# Patient Record
Sex: Female | Born: 1985 | Race: Black or African American | Hispanic: No | Marital: Single | Smoking: Former smoker
Health system: Southern US, Community
[De-identification: ages and names within clinical notes are randomized; demographics above are authoritative.]

## PROBLEM LIST (undated history)

## (undated) ENCOUNTER — Inpatient Hospital Stay (HOSPITAL_COMMUNITY): Payer: Self-pay

## (undated) DIAGNOSIS — Z789 Other specified health status: Secondary | ICD-10-CM

## (undated) DIAGNOSIS — N883 Incompetence of cervix uteri: Secondary | ICD-10-CM

## (undated) HISTORY — PX: NO PAST SURGERIES: SHX2092

---

## 2004-06-22 DIAGNOSIS — N883 Incompetence of cervix uteri: Secondary | ICD-10-CM

## 2005-06-22 HISTORY — PX: CERVICAL CERCLAGE: SHX1329

## 2011-06-19 ENCOUNTER — Encounter: Payer: Self-pay | Admitting: Emergency Medicine

## 2011-06-19 ENCOUNTER — Emergency Department (HOSPITAL_BASED_OUTPATIENT_CLINIC_OR_DEPARTMENT_OTHER)
Admission: EM | Admit: 2011-06-19 | Discharge: 2011-06-20 | Disposition: A | Discharge status: Home / Self Care | Payer: Self-pay | Attending: Emergency Medicine | Admitting: Emergency Medicine

## 2011-06-19 DIAGNOSIS — R109 Unspecified abdominal pain: Secondary | ICD-10-CM | POA: Insufficient documentation

## 2011-06-19 DIAGNOSIS — A5901 Trichomonal vulvovaginitis: Secondary | ICD-10-CM | POA: Insufficient documentation

## 2011-06-19 LAB — URINALYSIS, ROUTINE W REFLEX MICROSCOPIC
Glucose, UA: NEGATIVE mg/dL
Ketones, ur: NEGATIVE mg/dL
Protein, ur: NEGATIVE mg/dL

## 2011-06-19 LAB — URINE MICROSCOPIC-ADD ON

## 2011-06-19 LAB — WET PREP, GENITAL: Yeast Wet Prep HPF POC: NONE SEEN

## 2011-06-19 NOTE — ED Provider Notes (Signed)
History     CSN: [id number]  Arrival date & time 06/19/11  4098   First MD Initiated Contact with Patient 06/19/11 2244      Chief Complaint  Patient presents with  . Abdominal Pain    (Consider location/radiation/quality/duration/timing/severity/associated sxs/prior treatment) The history is provided by the patient.   Patient states that her stomach has been feeling "uncomfortable" for three days.  She states she wouldn't call it an ache but uncomfortable.  Patient increased frequency of loose bowel movements but no nausea or vomiting.  LMP 12/19 normal.  [id number].  No history of pid, history of gc in past.  Some vaginal discharge now.   History reviewed. No pertinent past medical history.  History reviewed. No pertinent past surgical history.  No family history on file.  History  Substance Use Topics  . Smoking status: Never Smoker   . Smokeless tobacco: Not on file  . Alcohol Use: No    OB History    Grav Para Term Preterm Abortions TAB SAB Ect Mult Living                  Review of Systems  All other systems reviewed and are negative.    Allergies  Review of patient's allergies indicates no known allergies.  Home Medications  No current outpatient prescriptions on file.  BP 117/75  Pulse 97  Temp(Src) 98.1 F (36.7 C) (Oral)  Resp 18  Wt 150 lb (68.04 kg)  SpO2 100%  LMP 06/05/2011  Physical Exam  Nursing note and vitals reviewed. Constitutional: She is oriented to person, place, and time. She appears well-developed and well-nourished.  HENT:  Head: Normocephalic and atraumatic.  Right Ear: External ear normal.  Left Ear: External ear normal.  Nose: Nose normal.  Mouth/Throat: Oropharynx is clear and moist.  Eyes: Conjunctivae are normal. Pupils are equal, round, and reactive to light.  Neck: Normal range of motion. Neck supple.  Cardiovascular: Normal rate, regular rhythm and normal heart sounds.   Pulmonary/Chest: Effort normal and breath  sounds normal.  Abdominal: Soft. Bowel sounds are normal.  Genitourinary: No tenderness or bleeding around the vagina. Vaginal discharge found.  Musculoskeletal: Normal range of motion.  Neurological: She is alert and oriented to person, place, and time.  Skin: Skin is warm and dry.  Psychiatric: She has a normal mood and affect. Her behavior is normal. Thought content normal.    ED Course  Procedures (including critical care time)  Labs Reviewed  URINALYSIS, ROUTINE W REFLEX MICROSCOPIC - Abnormal; Notable for the following:    Leukocytes, UA MODERATE (*)    All other components within normal limits  URINE MICROSCOPIC-ADD ON - Abnormal; Notable for the following:    Squamous Epithelial / LPF FEW (*)    All other components within normal limits  PREGNANCY, URINE   No results found.   No diagnosis found. Results for orders placed during the hospital encounter of 06/19/11  URINALYSIS, ROUTINE W REFLEX MICROSCOPIC      Component Value Range   Color, Urine YELLOW  YELLOW    APPearance CLEAR  CLEAR    Specific Gravity, Urine 1.026  1.005 - 1.030    pH 6.0  5.0 - 8.0    Glucose, UA NEGATIVE  NEGATIVE (mg/dL)   Hgb urine dipstick NEGATIVE  NEGATIVE    Bilirubin Urine NEGATIVE  NEGATIVE    Ketones, ur NEGATIVE  NEGATIVE (mg/dL)   Protein, ur NEGATIVE  NEGATIVE (mg/dL)   Urobilinogen, UA 1.0  0.0 - 1.0 (mg/dL)   Nitrite NEGATIVE  NEGATIVE    Leukocytes, UA MODERATE (*) NEGATIVE   PREGNANCY, URINE      Component Value Range   Preg Test, Ur NEGATIVE    URINE MICROSCOPIC-ADD ON      Component Value Range   Squamous Epithelial / LPF FEW (*) RARE    WBC, UA 7-10  <3 (WBC/hpf)   Bacteria, UA RARE  RARE   GC/CHLAMYDIA PROBE AMP, GENITAL      Component Value Range   GC Probe Amp, Genital NEGATIVE  NEGATIVE    Chlamydia, DNA Probe NEGATIVE  NEGATIVE   WET PREP, GENITAL      Component Value Range   Yeast, Wet Prep NONE SEEN  NONE SEEN    Trich, Wet Prep FEW (*) NONE SEEN    Clue  Cells, Wet Prep NONE SEEN  NONE SEEN    WBC, Wet Prep HPF POC MODERATE (*) NONE SEEN       MDM    Patient treated for trich, given trich infection patient treated prophylactically for gc and chlamydia     Hilario Quarry, MD 06/23/11 [phone]

## 2011-06-19 NOTE — ED Notes (Signed)
Pt states she has "discomfort" in abd since 12/24. Pt denies n/v/d but states BM have been more frequent.

## 2011-06-20 MED ORDER — ONDANSETRON 8 MG PO TBDP
8.0000 mg | ORAL_TABLET | Freq: Once | ORAL | Status: AC
  Administered 2011-06-20: 8 mg via ORAL
  Filled 2011-06-20: qty 1

## 2011-06-20 MED ORDER — METRONIDAZOLE 500 MG PO TABS
2000.0000 mg | ORAL_TABLET | Freq: Once | ORAL | Status: AC
  Administered 2011-06-20: 2000 mg via ORAL
  Filled 2011-06-20: qty 4

## 2011-06-20 MED ORDER — CEFTRIAXONE SODIUM 250 MG IJ SOLR
250.0000 mg | Freq: Once | INTRAMUSCULAR | Status: AC
  Administered 2011-06-20: 250 mg via INTRAMUSCULAR
  Filled 2011-06-20: qty 250

## 2011-06-20 MED ORDER — AZITHROMYCIN 250 MG PO TABS
1000.0000 mg | ORAL_TABLET | Freq: Once | ORAL | Status: AC
  Administered 2011-06-20: 1000 mg via ORAL
  Filled 2011-06-20: qty 4

## 2011-06-22 LAB — GC/CHLAMYDIA PROBE AMP, GENITAL: GC Probe Amp, Genital: NEGATIVE

## 2015-01-29 ENCOUNTER — Inpatient Hospital Stay (HOSPITAL_COMMUNITY): Payer: Self-pay

## 2015-01-29 ENCOUNTER — Inpatient Hospital Stay (HOSPITAL_COMMUNITY)
Admission: AD | Admit: 2015-01-29 | Discharge: 2015-01-29 | Disposition: A | Discharge status: Home / Self Care | Payer: Self-pay | Source: Ambulatory Visit | Attending: Obstetrics & Gynecology | Admitting: Obstetrics & Gynecology

## 2015-01-29 ENCOUNTER — Encounter (HOSPITAL_COMMUNITY): Payer: Self-pay | Admitting: *Deleted

## 2015-01-29 DIAGNOSIS — A599 Trichomoniasis, unspecified: Secondary | ICD-10-CM

## 2015-01-29 DIAGNOSIS — O046 Delayed or excessive hemorrhage following (induced) termination of pregnancy: Secondary | ICD-10-CM | POA: Insufficient documentation

## 2015-01-29 DIAGNOSIS — O209 Hemorrhage in early pregnancy, unspecified: Secondary | ICD-10-CM

## 2015-01-29 DIAGNOSIS — O0380 Unspecified complication following complete or unspecified spontaneous abortion: Secondary | ICD-10-CM

## 2015-01-29 HISTORY — DX: Other specified health status: Z78.9

## 2015-01-29 LAB — CBC WITH DIFFERENTIAL/PLATELET
BASOS ABS: 0 10*3/uL (ref 0.0–0.1)
BASOS PCT: 0 % (ref 0–1)
EOS ABS: 0.2 10*3/uL (ref 0.0–0.7)
Eosinophils Relative: 2 % (ref 0–5)
HCT: 38.9 % (ref 36.0–46.0)
HEMOGLOBIN: 12.8 g/dL (ref 12.0–15.0)
LYMPHS PCT: 29 % (ref 12–46)
Lymphs Abs: 2.5 10*3/uL (ref 0.7–4.0)
MCH: 29 pg (ref 26.0–34.0)
MCHC: 32.9 g/dL (ref 30.0–36.0)
MCV: 88 fL (ref 78.0–100.0)
Monocytes Absolute: 0.5 10*3/uL (ref 0.1–1.0)
Monocytes Relative: 6 % (ref 3–12)
NEUTROS PCT: 63 % (ref 43–77)
Neutro Abs: 5.4 10*3/uL (ref 1.7–7.7)
PLATELETS: 274 10*3/uL (ref 150–400)
RBC: 4.42 MIL/uL (ref 3.87–5.11)
RDW: 13.2 % (ref 11.5–15.5)
WBC: 8.6 10*3/uL (ref 4.0–10.5)

## 2015-01-29 LAB — TYPE AND SCREEN
ABO/RH(D): A POS
Antibody Screen: NEGATIVE

## 2015-01-29 LAB — URINALYSIS, ROUTINE W REFLEX MICROSCOPIC
BILIRUBIN URINE: NEGATIVE
GLUCOSE, UA: NEGATIVE mg/dL
KETONES UR: NEGATIVE mg/dL
NITRITE: NEGATIVE
PROTEIN: 30 mg/dL — AB
Specific Gravity, Urine: 1.02 (ref 1.005–1.030)
Urobilinogen, UA: 2 mg/dL — ABNORMAL HIGH (ref 0.0–1.0)
pH: 8.5 — ABNORMAL HIGH (ref 5.0–8.0)

## 2015-01-29 LAB — HCG, QUANTITATIVE, PREGNANCY: hCG, Beta Chain, Quant, S: 55 m[IU]/mL — ABNORMAL HIGH (ref <5)

## 2015-01-29 LAB — ABO/RH: ABO/RH(D): A POS

## 2015-01-29 LAB — URINE MICROSCOPIC-ADD ON

## 2015-01-29 LAB — WET PREP, GENITAL
Clue Cells Wet Prep HPF POC: NONE SEEN
Yeast Wet Prep HPF POC: NONE SEEN

## 2015-01-29 MED ORDER — METRONIDAZOLE 500 MG PO TABS
2000.0000 mg | ORAL_TABLET | Freq: Once | ORAL | Status: AC
  Administered 2015-01-29: 2000 mg via ORAL
  Filled 2015-01-29: qty 4

## 2015-01-29 MED ORDER — IBUPROFEN 600 MG PO TABS: 600.0000 mg | ORAL_TABLET | Freq: Four times a day (QID) | ORAL | Status: DC | PRN

## 2015-01-29 NOTE — MAU Note (Signed)
Pt. States she had an abortion on June 16 and had light to heavy bleeding on and off. States that for the last few days she had increased bleeding and more clots. Used 1 pad per hour today. Has been soaking throw her clothes today. Negative pregnancy test on 01/19/15. Pt. States she has on and off cramping. Has not had a period since procedure that she knows of. Denies fever and chills. Pt. States she has nausea and vomiting but not consistent. Does not have a follow up with regular gyne.

## 2015-01-29 NOTE — Discharge Instructions (Signed)
Pelvic Rest Pelvic rest is sometimes recommended for women when:   The placenta is partially or completely covering the opening of the cervix (placenta previa).  There is bleeding between the uterine wall and the amniotic sac in the first trimester (subchorionic hemorrhage).  The cervix begins to open without labor starting (incompetent cervix, cervical insufficiency).  The labor is too early (preterm labor). HOME CARE INSTRUCTIONS  Do not have sexual intercourse, stimulation, or an orgasm.  Do not use tampons, douche, or put anything in the vagina.  Do not lift anything over 10 pounds (4.5 kg).  Avoid strenuous activity or straining your pelvic muscles. SEEK MEDICAL CARE IF:  You have any vaginal bleeding during pregnancy. Treat this as a potential emergency.  You have cramping pain felt low in the stomach (stronger than menstrual cramps).  You notice vaginal discharge (watery, mucus, or bloody).  You have a low, dull backache.  There are regular contractions or uterine tightening. SEEK IMMEDIATE MEDICAL CARE IF: You have vaginal bleeding and have placenta previa.  Document Released: 10/03/2010 Document Revised: 08/31/2011 Document Reviewed: 10/03/2010 [facility] Patient Information 2015 [place], [place]. This information is not intended to replace advice given to you by your health care provider. Make sure you discuss any questions you have with your health care provider.  Incomplete Abortion  In an incomplete abortion, parts of the fetus or placenta (afterbirth) remain in the body.    SYMPTOMS   Vaginal bleeding or spotting, with or without cramps or pain.  Pain or cramping in the abdomen or lower back.  Passing fluid, tissue, or blood clots from the vagina. DIAGNOSIS  Your health care provider will perform a physical exam. You may also have an ultrasound to confirm the miscarriage. Blood or urine tests may also be ordered. TREATMENT   Usually, a dilation and  curettage (D&C) procedure is performed. During a D&C procedure, the cervix is widened (dilated) and any remaining fetal or placental tissue is gently removed from the uterus.  Antibiotic medicines are prescribed if there is an infection. Other medicines may be given to reduce the size of the uterus (contract) if there is a lot of bleeding.  If you have Rh negative blood and your baby was Rh positive, you will need a Rho (D) immune globulin shot. This shot will protect any future baby from having Rh blood problems in future pregnancies.  You may be confined to bed rest. This means you should stay in bed and only get up to use the bathroom. HOME CARE INSTRUCTIONS   Rest as directed by your health care provider.  Restrict activity as directed by your health care provider. You may be allowed to continue light activity if curettage was not done but you require further treatment.  Keep track of the number of pads you use each day. Keep track of how soaked (saturated) they are. Record this information.  Do not  use tampons.  Do not douche or have sexual intercourse until approved by your health care provider.  Keep all follow-up appointments for reevaluation and continuing management.  Only take over-the-counter or prescription medicines for pain, fever, or discomfort as directed by your health care provider.  Take antibiotic medicine as directed by your health care provider. Make sure you finish it even if you start to feel better. SEEK IMMEDIATE MEDICAL CARE IF:   You experience severe cramps in your stomach, back, or abdomen.  You have an unexplained temperature (make sure to record these temperatures).  You  pass large clots or tissue (save these for your health care provider to inspect).  Your bleeding increases.  You become light-headed, weak, or have fainting episodes. MAKE SURE YOU:   Understand these instructions.  Will watch your condition.  Will get help right away if you  are not doing well or get worse. Document Released: 06/08/2005 Document Revised: 10/23/2013 Document Reviewed: 01/05/2013 [facility] Patient Information 2015 [place], [place]. This information is not intended to replace advice given to you by your health care provider. Make sure you discuss any questions you have with your health care provider.  Trichomoniasis Trichomoniasis is an infection caused by an organism called Trichomonas. The infection can affect both women and men. In women, the outer female genitalia and the vagina are affected. In men, the penis is mainly affected, but the prostate and other reproductive organs can also be involved. Trichomoniasis is a sexually transmitted infection (STI) and is most often passed to another person through sexual contact.  RISK FACTORS  Having unprotected sexual intercourse.  Having sexual intercourse with an infected partner. SIGNS AND SYMPTOMS  Symptoms of trichomoniasis in women include:  Abnormal gray-green frothy vaginal discharge.  Itching and irritation of the vagina.  Itching and irritation of the area outside the vagina. Symptoms of trichomoniasis in men include:   Penile discharge with or without pain.  Pain during urination. This results from inflammation of the urethra. DIAGNOSIS  Trichomoniasis may be found during a Pap test or physical exam. Your health care provider may use one of the following methods to help diagnose this infection:  Examining vaginal discharge under a microscope. For men, urethral discharge would be examined.  Testing the pH of the vagina with a test tape.  Using a vaginal swab test that checks for the Trichomonas organism. A test is available that provides results within a few minutes.  Doing a culture test for the organism. This is not usually needed. TREATMENT   You may be given medicine to fight the infection. Women should inform their health care provider if they could be or are pregnant. Some  medicines used to treat the infection should not be taken during pregnancy.  Your health care provider may recommend over-the-counter medicines or creams to decrease itching or irritation.  Your sexual partner will need to be treated if infected. HOME CARE INSTRUCTIONS   Take medicines only as directed by your health care provider.  Take over-the-counter medicine for itching or irritation as directed by your health care provider.  Do not have sexual intercourse while you have the infection.  Women should not douche or wear tampons while they have the infection.  Discuss your infection with your partner. Your partner may have gotten the infection from you, or you may have gotten it from your partner.  Have your sex partner get examined and treated if necessary.  Practice safe, informed, and protected sex.  See your health care provider for other STI testing. SEEK MEDICAL CARE IF:   You still have symptoms after you finish your medicine.  You develop abdominal pain.  You have pain when you urinate.  You have bleeding after sexual intercourse.  You develop a rash.  Your medicine makes you sick or makes you throw up (vomit). MAKE SURE YOU:  Understand these instructions.  Will watch your condition.  Will get help right away if you are not doing well or get worse. Document Released: 12/02/2000 Document Revised: 10/23/2013 Document Reviewed: 03/20/2013 [facility] Patient Information 2015 [place], [place]. This information  is not intended to replace advice given to you by your health care provider. Make sure you discuss any questions you have with your health care provider.

## 2015-01-29 NOTE — MAU Provider Note (Signed)
History     CSN: [id number]  Arrival date and time: 01/29/15 1548   First Provider Initiated Contact with Patient 01/29/15 1703      Chief Complaint  Patient presents with  . Vaginal Bleeding   This is a 29 y.o. female who is 7+ weeks post elective abortion presents with complaint of persistent vaginal bleeding.  Was seen by the abortion clinic last week and told pregnancy test was negative. States it got heavier yesterday and came in. Has some intermittent crampy pain at times. Does not radiate. Comes and goes.    Vaginal Bleeding The patient's primary symptoms include vaginal bleeding. The patient's pertinent negatives include no genital itching, genital lesions, genital odor or genital rash. This is a recurrent problem. The current episode started more than 1 month ago. The problem occurs constantly. The problem has been gradually worsening. The pain is mild. The problem affects both sides. She is pregnant (unsure if prior or new pregnancy). Associated symptoms include abdominal pain. Pertinent negatives include no back pain, chills, constipation, diarrhea, dysuria, fever, nausea or vomiting. The vaginal discharge was bloody. The vaginal bleeding is heavier than menses. She has been passing clots. She has not been passing tissue. Nothing aggravates the symptoms. She has tried nothing for the symptoms. She is sexually active. She uses nothing for contraception. Her past medical history is significant for a gynecological surgery (elective abortion).   \RN Note: Had AB 6/17; no complications. Bleeding has been up and down, has never stopped bleeding. Was to have f/u at 4 wks,called and did not go, went in at [facility] 7/30. Was still bleeding. Had [place] was told everything was fine, neg preg test. Bleeding had picked up 7/28, prior to being seen; was told it was probable cycle. Bleeding continues, has gotten heavier yesterday. Bled through clothes; short amt of time. Denies dizziness  OB History     Gravida Para Term Preterm AB TAB SAB Ectopic Multiple Living   5         1      No past medical history on file.  No past surgical history on file.  No family history on file.  History  Substance Use Topics  . Smoking status: Never Smoker   . Smokeless tobacco: Not on file  . Alcohol Use: No    Allergies: No Known Allergies  No prescriptions prior to admission   Medical, Surgical, Family and Social histories reviewed and are listed above.  Medications and allergies reviewed.   Review of Systems  Constitutional: Negative for fever, chills and malaise/fatigue.  Gastrointestinal: Positive for abdominal pain. Negative for nausea, vomiting, diarrhea and constipation.  Genitourinary: Positive for vaginal bleeding. Negative for dysuria.       Heavy vaginal bleeding, heavier today  Musculoskeletal: Negative for back pain.  other systems negative  Physical Exam   Blood pressure 121/70, pulse 68, temperature 98.7 F (37.1 C), temperature source Oral, resp. rate 20, height 5\' 4"  (1.626 m), weight 165 lb 12.8 oz (75.206 kg).  Physical Exam  Constitutional: She is oriented to person, place, and time. She appears well-developed and well-nourished. No distress.  HENT:  Head: Normocephalic.  Cardiovascular: Normal rate, regular rhythm and normal heart sounds.  Exam reveals no gallop and no friction rub.   No murmur heard. Respiratory: Effort normal and breath sounds normal. No respiratory distress. She has no wheezes. She has no rales. She exhibits no tenderness.  GI: Soft. She exhibits no distension and no mass. There is tenderness (  uterus slightly tender). There is no rebound and no guarding.  Genitourinary: Vaginal discharge found.  Speculum exam:  Moderate liquid dark red blood in vault Cervix closed, somewhat stenotic  Uterus 6-8 wk size Adnexae nontender  Musculoskeletal: Normal range of motion.  Neurological: She is alert and oriented to person, place, and time.   Skin: Skin is warm and dry.  Psychiatric: She has a normal mood and affect.   Results for orders placed or performed during the hospital encounter of 01/29/15 (from the past 24 hour(s))  Urinalysis, Routine w reflex microscopic (not at [facility])     Status: Abnormal   Collection Time: 01/29/15  4:05 PM  Result Value Ref Range   Color, Urine YELLOW YELLOW   APPearance HAZY (A) CLEAR   Specific Gravity, Urine 1.020 1.005 - 1.030   pH 8.5 (H) 5.0 - 8.0   Glucose, UA NEGATIVE NEGATIVE mg/dL   Hgb urine dipstick SMALL (A) NEGATIVE   Bilirubin Urine NEGATIVE NEGATIVE   Ketones, ur NEGATIVE NEGATIVE mg/dL   Protein, ur 30 (A) NEGATIVE mg/dL   Urobilinogen, UA 2.0 (H) 0.0 - 1.0 mg/dL   Nitrite NEGATIVE NEGATIVE   Leukocytes, UA MODERATE (A) NEGATIVE  Urine microscopic-add on     Status: Abnormal   Collection Time: 01/29/15  4:05 PM  Result Value Ref Range   Squamous Epithelial / LPF FEW (A) RARE   WBC, UA 21-50 <3 WBC/hpf   RBC / HPF 0-2 <3 RBC/hpf  hCG, quantitative, pregnancy     Status: Abnormal   Collection Time: 01/29/15  4:25 PM  Result Value Ref Range   hCG, Beta Chain, Quant, S 55 (H) <5 mIU/mL  CBC with Differential/Platelet     Status: None   Collection Time: 01/29/15  4:25 PM  Result Value Ref Range   WBC 8.6 4.0 - 10.5 K/uL   RBC 4.42 3.87 - 5.11 MIL/uL   Hemoglobin 12.8 12.0 - 15.0 g/dL   HCT 16.1 09.6 - 04.5 %   MCV 88.0 78.0 - 100.0 fL   MCH 29.0 26.0 - 34.0 pg   MCHC 32.9 30.0 - 36.0 g/dL   RDW 40.9 81.1 - 91.4 %   Platelets 274 150 - 400 K/uL   Neutrophils Relative % 63 43 - 77 %   Neutro Abs 5.4 1.7 - 7.7 K/uL   Lymphocytes Relative 29 12 - 46 %   Lymphs Abs 2.5 0.7 - 4.0 K/uL   Monocytes Relative 6 3 - 12 %   Monocytes Absolute 0.5 0.1 - 1.0 K/uL   Eosinophils Relative 2 0 - 5 %   Eosinophils Absolute 0.2 0.0 - 0.7 K/uL   Basophils Relative 0 0 - 1 %   Basophils Absolute 0.0 0.0 - 0.1 K/uL  Type and screen     Status: None   Collection Time: 01/29/15  4:25  PM  Result Value Ref Range   ABO/RH(D) A POS    Antibody Screen NEG    Sample Expiration 02/01/2015   Wet prep, genital     Status: Abnormal   Collection Time: 01/29/15  5:15 PM  Result Value Ref Range   Yeast Wet Prep HPF POC NONE SEEN NONE SEEN   Trich, Wet Prep FEW (A) NONE SEEN   Clue Cells Wet Prep HPF POC NONE SEEN NONE SEEN   WBC, Wet Prep HPF POC FEW (A) NONE SEEN   US Ob Comp Less 14 Wks  01/29/2015   CLINICAL DATA:  Bleeding.  EXAM: OBSTETRIC <  14 WK [place] AND TRANSVAGINAL OB US  TECHNIQUE: Both transabdominal and transvaginal ultrasound examinations were performed for complete evaluation of the gestation as well as the maternal uterus, adnexal regions, and pelvic cul-de-sac. Transvaginal technique was performed to assess early pregnancy.  COMPARISON:  None.  FINDINGS: Intrauterine gestational sac: The endometrium appears thickened, heterogeneous with areas of internal blood flow. Question 9 mm intrauterine fluid collection, image number 42 of 84.  Yolk sac:  No  Embryo:  No  Cardiac Activity: None  Heart Rate: Not applicable  bpm  Maternal uterus/adnexae:  Subchorionic hemorrhage: None  Right ovary: Normal  Left ovary: Normal  Other :None  Free fluid:  None  IMPRESSION: Thickened and heterogeneous appearance of the endometrium. No diagnostic intrauterine pregnancy is identified. Within this area of thickened endometrium is at least 1 cystic structure measuring 9 mm. In the setting of recent failed pregnancy this may represent retained products of conception. Less favored would be decidual reaction from new pregnancy with early intrauterine gestational sac. Careful clinical correlation and correlation with serial quantitative beta HCG is advised.   Electronically Signed   By: Signa Kell M.D.   On: 01/29/2015 18:25   US Ob Transvaginal  01/29/2015   CLINICAL DATA:  Bleeding.  EXAM: OBSTETRIC <14 WK [place] AND TRANSVAGINAL OB US  TECHNIQUE: Both transabdominal and transvaginal ultrasound  examinations were performed for complete evaluation of the gestation as well as the maternal uterus, adnexal regions, and pelvic cul-de-sac. Transvaginal technique was performed to assess early pregnancy.  COMPARISON:  None.  FINDINGS: Intrauterine gestational sac: The endometrium appears thickened, heterogeneous with areas of internal blood flow. Question 9 mm intrauterine fluid collection, image number 42 of 84.  Yolk sac:  No  Embryo:  No  Cardiac Activity: None  Heart Rate: Not applicable  bpm  Maternal uterus/adnexae:  Subchorionic hemorrhage: None  Right ovary: Normal  Left ovary: Normal  Other :None  Free fluid:  None  IMPRESSION: Thickened and heterogeneous appearance of the endometrium. No diagnostic intrauterine pregnancy is identified. Within this area of thickened endometrium is at least 1 cystic structure measuring 9 mm. In the setting of recent failed pregnancy this may represent retained products of conception. Less favored would be decidual reaction from new pregnancy with early intrauterine gestational sac. Careful clinical correlation and correlation with serial quantitative beta HCG is advised.   Electronically Signed   By: Signa Kell M.D.   On: 01/29/2015 18:25     MAU Course  Procedures  MDM Consulted Dr Debroah Loop.  Will check [place] since Quant is 15.  Cannot ascertain if this is retained tissue from prior pregnancy or new pregnancy. Patient does admit to having sex after abortion Will need to recheck HCG in 48 hours.   If still elevated, may need Methergine or Cytotec.  Assessment and Plan  A:  Post Abortion bleeding      Persistent HCG level      Cannot rule out ectopic, or retained products of conception, or new pregnancy  P:  Discharge home.       Recommend ectopic precautions and bleeding precautions. Notified she may continue to pass clots       Recommend repeat Quant HCG in 48 hours.        Decision re:  D&C vs medication to be made at that  time   [facility] 01/29/2015, 5:06 PM

## 2015-01-29 NOTE — MAU Note (Signed)
Had AB 6/17; no complications.   Bleeding has been up and down, has never stopped bleeding. Was to have f/u at 4 wks,called and did not go, went in at [facility] 7/30.  Was still bleeding.  Had [place] was told everything was fine, neg preg test. Bleeding had picked up 7/28, prior to being seen; was told it was probable cycle.  Bleeding continues, has gotten heavier yesterday.  Bled through clothes; short amt of time.  Denies dizziness.

## 2015-01-30 LAB — HIV ANTIBODY (ROUTINE TESTING W REFLEX): HIV SCREEN 4TH GENERATION: NONREACTIVE

## 2015-01-30 LAB — GC/CHLAMYDIA PROBE AMP (~~LOC~~) NOT AT ARMC
CHLAMYDIA, DNA PROBE: NEGATIVE
Neisseria Gonorrhea: NEGATIVE

## 2015-01-31 ENCOUNTER — Inpatient Hospital Stay (HOSPITAL_COMMUNITY)
Admission: AD | Admit: 2015-01-31 | Discharge: 2015-01-31 | Disposition: A | Discharge status: Home / Self Care | Payer: Self-pay | Source: Ambulatory Visit | Attending: Obstetrics & Gynecology | Admitting: Obstetrics & Gynecology

## 2015-01-31 DIAGNOSIS — O039 Complete or unspecified spontaneous abortion without complication: Secondary | ICD-10-CM

## 2015-01-31 DIAGNOSIS — Z332 Encounter for elective termination of pregnancy: Secondary | ICD-10-CM | POA: Insufficient documentation

## 2015-01-31 DIAGNOSIS — Z09 Encounter for follow-up examination after completed treatment for conditions other than malignant neoplasm: Secondary | ICD-10-CM | POA: Insufficient documentation

## 2015-01-31 LAB — HCG, QUANTITATIVE, PREGNANCY: HCG, BETA CHAIN, QUANT, S: 36 m[IU]/mL — AB (ref <5)

## 2015-01-31 NOTE — MAU Note (Signed)
Pt presents to MAU for follow up quant. Pt denies any pain reports small amount of bleeding

## 2015-01-31 NOTE — MAU Provider Note (Signed)
Subjective:   Pt is a 29 y.o. [id number] here for follow-up BHCG.  Upon review of the records patient was last seen on 01/31/15 for vaginal bleeding.   BHCG on that day was 55.  Ultrasound showed "thickened and heterogeneous appearance of the endometrium".  GC/CT and wet prep were collected.  Results were positive for trich; patient treated at that time. Pt here today with no report of abdominal pain. States vaginal bleeding is improving.     Patient had TAB 6/17 in [place]; had f/u in [place] on 7/30 with normal ultrasound and negative UPT.   Objective: Physical Exam  Filed Vitals:   01/31/15 1835  BP: 117/76  Pulse: 70  Resp: 18   Constitutional: She is oriented to person, place, and time. She appears well-developed and well-nourished. No distress.  Neck: Normal range of motion.  Pulmonary/Chest: Effort normal. No respiratory distress.  Musculoskeletal: Normal range of motion.  Neurological: She is alert and oriented to person, place, and time.  Skin: Skin is warm and dry.    Assessment: 29 y.o. A5W0981  Follow-up BHCG Results for orders placed or performed during the hospital encounter of 01/31/15 (from the past 24 hour(s))  hCG, quantitative, pregnancy     Status: Abnormal   Collection Time: 01/31/15  6:18 PM  Result Value Ref Range   hCG, Beta Chain, Quant, S 36 (H) <5 mIU/mL    Plan: C/w Dr. Deysi Soldo Fulling & patient to be discharged home, no follow up necessary.  Discharged home in stable condition.  Schedule follow up at [facility] for routine gyn care as planning.  Discussed reasons to return to MAU  Judeth Horn, NP

## 2015-01-31 NOTE — Discharge Instructions (Signed)
Miscarriage °A miscarriage is the loss of an unborn baby (fetus) before the 20th week of pregnancy. The cause is often unknown.  °HOME CARE °· You may need to stay in bed (bed rest), or you may be able to do light activity. Go about activity as told by your doctor. °· Have help at home. °· Write down how many pads you use each day. Write down how soaked they are. °· Do not use tampons. Do not wash out your vagina (douche) or have sex (intercourse) until your doctor approves. °· Only take medicine as told by your doctor. °· Do not take aspirin. °· Keep all doctor visits as told. °· If you or your partner have problems with grieving, talk to your doctor. You can also try counseling. Give yourself time to grieve before trying to get pregnant again. °GET HELP RIGHT AWAY IF: °· You have bad cramps or pain in your back or belly (abdomen). °· You have a fever. °· You pass large clumps of blood (clots) from your vagina that are walnut-sized or larger. Save the clumps for your doctor to see. °· You pass large amounts of tissue from your vagina. Save the tissue for your doctor to see. °· You have more bleeding. °· You have thick, bad-smelling fluid (discharge) coming from the vagina. °· You get lightheaded, weak, or you pass out (faint). °· You have chills. °MAKE SURE YOU: °· Understand these instructions. °· Will watch your condition. °· Will get help right away if you are not doing well or get worse. °Document Released: 08/31/2011 Document Reviewed: 08/31/2011 °ExitCare® Patient Information ©2015 [employer]. This information is not intended to replace advice given to you by your health care provider. Make sure you discuss any questions you have with your health care provider. ° °

## 2015-05-23 LAB — HM PAP SMEAR

## 2015-12-09 ENCOUNTER — Ambulatory Visit: Payer: Self-pay | Admitting: Physician Assistant

## 2015-12-09 MED FILL — AMOXICILLIN 500 MG CAPSULE: 500 | 10 days supply | Qty: 40 | Fill #0

## 2015-12-20 ENCOUNTER — Telehealth: Payer: Self-pay | Admitting: General Practice

## 2015-12-20 MED ORDER — FLUCONAZOLE 150 MG PO TABS: 150.0000 mg | ORAL_TABLET | ORAL | Status: DC

## 2015-12-20 MED FILL — FLUCONAZOLE 150 MG TABLET: 150 | 2 days supply | Qty: 2 | Fill #0

## 2015-12-20 NOTE — Telephone Encounter (Signed)
Diflucan 150 mg #2  1 po qd x1, may repeat in 3 days  

## 2015-12-20 NOTE — Telephone Encounter (Signed)
Relation to ZO:XWRUpt:self Call back number: Pharmacy: [facility]   Reason for call:  Patient had a root canal patient was prescribed amoxicillin which caused yeast infection symptoms per dentist patient needs diflucan.

## 2015-12-20 NOTE — Telephone Encounter (Signed)
Please advise      KP 

## 2015-12-20 NOTE — Telephone Encounter (Signed)
Rx faxed.    KP 

## 2015-12-26 ENCOUNTER — Encounter: Payer: Self-pay | Admitting: Behavioral Health

## 2015-12-26 ENCOUNTER — Telehealth: Payer: Self-pay | Admitting: Behavioral Health

## 2015-12-26 NOTE — Telephone Encounter (Signed)
Pre-Visit Call completed with patient and chart updated.   Pre-Visit Info documented in Specialty Comments under Snap[employer]ot.    

## 2015-12-27 ENCOUNTER — Ambulatory Visit: Payer: Self-pay | Admitting: Physician Assistant

## 2016-03-04 DIAGNOSIS — Z113 Encounter for screening for infections with a predominantly sexual mode of transmission: Secondary | ICD-10-CM | POA: Diagnosis not present

## 2016-03-04 DIAGNOSIS — Z01419 Encounter for gynecological examination (general) (routine) without abnormal findings: Secondary | ICD-10-CM | POA: Diagnosis not present

## 2016-03-04 DIAGNOSIS — H52223 Regular astigmatism, bilateral: Secondary | ICD-10-CM | POA: Diagnosis not present

## 2016-03-04 DIAGNOSIS — Z6828 Body mass index (BMI) 28.0-28.9, adult: Secondary | ICD-10-CM | POA: Diagnosis not present

## 2016-03-24 DIAGNOSIS — Z3491 Encounter for supervision of normal pregnancy, unspecified, first trimester: Secondary | ICD-10-CM | POA: Diagnosis not present

## 2016-03-24 LAB — OB RESULTS CONSOLE GC/CHLAMYDIA
Chlamydia: NEGATIVE
GC PROBE AMP, GENITAL: NEGATIVE

## 2016-03-24 LAB — OB RESULTS CONSOLE ABO/RH: RH TYPE: POSITIVE

## 2016-03-24 LAB — OB RESULTS CONSOLE HEPATITIS B SURFACE ANTIGEN: Hepatitis B Surface Ag: NEGATIVE

## 2016-03-24 LAB — OB RESULTS CONSOLE RUBELLA ANTIBODY, IGM: Rubella: IMMUNE

## 2016-03-24 LAB — OB RESULTS CONSOLE RPR: RPR: NONREACTIVE

## 2016-03-24 LAB — OB RESULTS CONSOLE HIV ANTIBODY (ROUTINE TESTING): HIV: NONREACTIVE

## 2016-03-24 LAB — OB RESULTS CONSOLE ANTIBODY SCREEN: ANTIBODY SCREEN: NEGATIVE

## 2016-03-26 ENCOUNTER — Encounter (HOSPITAL_BASED_OUTPATIENT_CLINIC_OR_DEPARTMENT_OTHER): Payer: Self-pay | Admitting: *Deleted

## 2016-03-26 ENCOUNTER — Emergency Department (HOSPITAL_BASED_OUTPATIENT_CLINIC_OR_DEPARTMENT_OTHER)
Admission: EM | Admit: 2016-03-26 | Discharge: 2016-03-26 | Disposition: A | Discharge status: Home / Self Care | Payer: 59 | Attending: Emergency Medicine | Admitting: Emergency Medicine

## 2016-03-26 DIAGNOSIS — Z87891 Personal history of nicotine dependence: Secondary | ICD-10-CM | POA: Insufficient documentation

## 2016-03-26 DIAGNOSIS — Z3A01 Less than 8 weeks gestation of pregnancy: Secondary | ICD-10-CM | POA: Insufficient documentation

## 2016-03-26 DIAGNOSIS — O200 Threatened abortion: Secondary | ICD-10-CM

## 2016-03-26 DIAGNOSIS — O209 Hemorrhage in early pregnancy, unspecified: Secondary | ICD-10-CM | POA: Diagnosis present

## 2016-03-26 DIAGNOSIS — Z79899 Other long term (current) drug therapy: Secondary | ICD-10-CM | POA: Insufficient documentation

## 2016-03-26 DIAGNOSIS — Z3A08 8 weeks gestation of pregnancy: Secondary | ICD-10-CM | POA: Diagnosis not present

## 2016-03-26 HISTORY — DX: Incompetence of cervix uteri: N88.3

## 2016-03-26 LAB — WET PREP, GENITAL
SPERM: NONE SEEN
Trich, Wet Prep: NONE SEEN
YEAST WET PREP: NONE SEEN

## 2016-03-26 LAB — CBC WITH DIFFERENTIAL/PLATELET
Basophils Absolute: 0 10*3/uL (ref 0.0–0.1)
Basophils Relative: 0 %
EOS ABS: 0.1 10*3/uL (ref 0.0–0.7)
EOS PCT: 1 %
HCT: 36.6 % (ref 36.0–46.0)
HEMOGLOBIN: 12.5 g/dL (ref 12.0–15.0)
LYMPHS ABS: 1.9 10*3/uL (ref 0.7–4.0)
Lymphocytes Relative: 36 %
MCH: 28.8 pg (ref 26.0–34.0)
MCHC: 34.2 g/dL (ref 30.0–36.0)
MCV: 84.3 fL (ref 78.0–100.0)
MONO ABS: 0.7 10*3/uL (ref 0.1–1.0)
MONOS PCT: 13 %
Neutro Abs: 2.6 10*3/uL (ref 1.7–7.7)
Neutrophils Relative %: 50 %
PLATELETS: 281 10*3/uL (ref 150–400)
RBC: 4.34 MIL/uL (ref 3.87–5.11)
RDW: 12.4 % (ref 11.5–15.5)
WBC: 5.2 10*3/uL (ref 4.0–10.5)

## 2016-03-26 LAB — HCG, QUANTITATIVE, PREGNANCY: hCG, Beta Chain, Quant, S: 212592 m[IU]/mL — ABNORMAL HIGH (ref <5)

## 2016-03-26 LAB — BASIC METABOLIC PANEL
Anion gap: 6 (ref 5–15)
BUN: 9 mg/dL (ref 6–20)
CHLORIDE: 103 mmol/L (ref 101–111)
CO2: 24 mmol/L (ref 22–32)
CREATININE: 0.62 mg/dL (ref 0.44–1.00)
Calcium: 9.3 mg/dL (ref 8.9–10.3)
GFR calc Af Amer: >60 mL/min (ref >60)
GFR calc non Af Amer: >60 mL/min (ref >60)
Glucose, Bld: 82 mg/dL (ref 65–99)
Potassium: 4 mmol/L (ref 3.5–5.1)
SODIUM: 133 mmol/L — AB (ref 135–145)

## 2016-03-26 NOTE — ED Triage Notes (Signed)
[redacted] weeks pregnant. She is having vaginal bleeding. No pain.

## 2016-03-26 NOTE — ED Provider Notes (Signed)
[facility] Provider Note   CSN: [id number] Arrival date & time: 03/26/16  1127     History   Chief Complaint Chief Complaint  Patient presents with  . Vaginal Bleeding  . [redacted] Weeks Pregnant    HPI Brittney Anderson is a 30 y.o. female.  [id number] presenting with painless vaginal bleeding that occurred 1-2 hours prior to arrival.   The history is provided by the patient.  Vaginal Bleeding  Primary symptoms include vaginal bleeding.  Primary symptoms include no discharge, no pelvic pain, no dysuria. There has been no fever. This is a new problem. The current episode started 1 to 2 hours ago. The problem occurs constantly. The problem has been gradually improving. The symptoms occur at rest. She is pregnant. LMP: roughly 6 weeks ago, 02/09/16. Pertinent negatives include no abdominal pain, no nausea, no vomiting, no light-headedness and no dizziness. Associated medical issues include miscarriage. Associated medical issues do not include ectopic pregnancy. Associated medical issues comments: incompetent cervix.    Past Medical History:  Diagnosis Date  . Incompetent cervix   . Medical history non-contributory     There are no active problems to display for this patient.   Past Surgical History:  Procedure Laterality Date  . NO PAST SURGERIES      OB History    Gravida Para Term Preterm AB Living   6 2 1 1 3 1    SAB TAB Ectopic Multiple Live Births   1 2     2        Home Medications    Prior to Admission medications   Medication Sig Start Date End Date Taking? Authorizing Provider  Prenatal Vit-Fe Fumarate-FA (PRENATAL VITAMIN PO) Take by mouth.   Yes Historical Provider, MD    Family History No family history on file.  Social History Social History  Substance Use Topics  . Smoking status: Former Smoker    Packs/day: 1.00    Years: 0.50    Types: Cigars  . Smokeless tobacco: Never Used  . Alcohol use No     Allergies   Review of patient's  allergies indicates no known allergies.   Review of Systems Review of Systems  Gastrointestinal: Negative for abdominal pain, nausea and vomiting.  Genitourinary: Positive for vaginal bleeding. Negative for dysuria and pelvic pain.  Neurological: Negative for dizziness and light-headedness.  All other systems reviewed and are negative.    Physical Exam Updated Vital Signs BP 130/68   Pulse 104   Temp 97.7 F (36.5 C) (Oral)   Resp 20   Ht 5\' 4"  (1.626 m)   Wt 175 lb (79.4 kg)   SpO2 100%   BMI 30.04 kg/m   Physical Exam  Constitutional: She is oriented to person, place, and time. She appears well-developed and well-nourished. No distress.  HENT:  Head: Normocephalic.  Nose: Nose normal.  Eyes: Conjunctivae are normal.  Neck: Neck supple. No tracheal deviation present.  Cardiovascular: Normal rate and regular rhythm.   Pulmonary/Chest: Effort normal. No respiratory distress.  Abdominal: Soft. She exhibits no distension.  Genitourinary: Cervix exhibits no motion tenderness. Right adnexum displays no mass, no tenderness and no fullness. Left adnexum displays no mass, no tenderness and no fullness. There is bleeding (scant without pooling) in the vagina. No vaginal discharge found.  Genitourinary Comments: Irregularly shaped cervix with closed os  Neurological: She is alert and oriented to person, place, and time.  Skin: Skin is warm and dry.  Psychiatric: She has a normal  mood and affect.     ED Treatments / Results  Labs (all labs ordered are listed, but only abnormal results are displayed) Labs Reviewed  WET PREP, GENITAL - Abnormal; Notable for the following:       Result Value   Clue Cells Wet Prep HPF POC PRESENT (*)    WBC, Wet Prep HPF POC MODERATE (*)    All other components within normal limits  BASIC METABOLIC PANEL - Abnormal; Notable for the following:    Sodium 133 (*)    All other components within normal limits  CBC WITH DIFFERENTIAL/PLATELET  HCG,  QUANTITATIVE, PREGNANCY  GC/CHLAMYDIA PROBE AMP ([place]) NOT AT [facility]    EKG  EKG Interpretation None       Radiology No results found.  Procedures Procedures (including critical care time)  Emergency Focused Ultrasound Exam Limited ultrasound of the pregnant uterus in the first trimester  Performed and interpreted by Dr. Clydene PughKnott Indication: determination of pregnancy location  Multiple views of the uterus are obtained with a multi-frequency probe by transabdominal approach.  Findings: + GS, + fetal pole, flicker cardiac activity noted Interpretation: + IUP of undetermined viability Images archived electronically.  CPT Code [id number]  Medications Ordered in ED Medications - No data to display   Initial Impression / Assessment and Plan / ED Course  I have reviewed the triage vital signs and the nursing notes.  Pertinent labs & imaging results that were available during my care of the patient were reviewed by me and considered in my medical decision making (see chart for details).  Clinical Course   30 yo [id number] presents with vaginal bleeding and passage of clot starting suddenly at 11:30 AM today that occurred painlessly and has had spotting since. She believes she is roughly 6-[redacted] weeks pregnant but has not had a full prenatal scan. She has an appointment in 7 days for stating and pelvic exam. She has required cerclage in the past due to incompetent cervix resulting in a spontaneous miscarriage and has had 2 elective abortions with one premature birth that did not survive. IUP noted on ultrasound with cardiac flicker evident. Has continued low volume bleeding and small clots in vaginal vault which tapered off over ED course. Her blood type is A+. Quant pending d/t technical difficulties with equipment at this facility and need to send out.   Discussed with Pt's primary OB team who plan to schedule formal dating US early next week and will follow up. Instructed to contact OB  directly or [facility] to [facility] to update them with changes. Plan to follow up with PCP as needed and return precautions discussed for worsening or new concerning symptoms.   Final Clinical Impressions(s) / ED Diagnoses   Final diagnoses:  Threatened miscarriage    New Prescriptions New Prescriptions   No medications on file     Lyndal Pulleyaniel Naly Schwanz, MD 03/26/16 1749

## 2016-03-27 LAB — GC/CHLAMYDIA PROBE AMP (~~LOC~~) NOT AT ARMC
Chlamydia: NEGATIVE
Neisseria Gonorrhea: NEGATIVE

## 2016-03-30 DIAGNOSIS — O3680X0 Pregnancy with inconclusive fetal viability, not applicable or unspecified: Secondary | ICD-10-CM | POA: Diagnosis not present

## 2016-03-30 DIAGNOSIS — Z3A01 Less than 8 weeks gestation of pregnancy: Secondary | ICD-10-CM | POA: Diagnosis not present

## 2016-04-02 DIAGNOSIS — N898 Other specified noninflammatory disorders of vagina: Secondary | ICD-10-CM | POA: Diagnosis not present

## 2016-04-02 DIAGNOSIS — Z8751 Personal history of pre-term labor: Secondary | ICD-10-CM | POA: Diagnosis not present

## 2016-04-02 DIAGNOSIS — Z3A01 Less than 8 weeks gestation of pregnancy: Secondary | ICD-10-CM | POA: Diagnosis not present

## 2016-04-02 DIAGNOSIS — Z22330 Carrier of Group B streptococcus: Secondary | ICD-10-CM | POA: Diagnosis not present

## 2016-04-02 DIAGNOSIS — O23599 Infection of other part of genital tract in pregnancy, unspecified trimester: Secondary | ICD-10-CM | POA: Diagnosis not present

## 2016-04-02 DIAGNOSIS — Z3491 Encounter for supervision of normal pregnancy, unspecified, first trimester: Secondary | ICD-10-CM | POA: Diagnosis not present

## 2016-04-02 DIAGNOSIS — O0991 Supervision of high risk pregnancy, unspecified, first trimester: Secondary | ICD-10-CM | POA: Diagnosis not present

## 2016-04-03 LAB — OB RESULTS CONSOLE GBS: GBS: POSITIVE

## 2016-04-06 MED FILL — metroNIDAZOLE 500 MG TABS: 500 | 7 days supply | Qty: 14 | Fill #0

## 2016-04-06 MED FILL — PENICILLIN VK 500 MG TABLET: 500 | 7 days supply | Qty: 14 | Fill #0

## 2016-05-06 ENCOUNTER — Other Ambulatory Visit: Payer: Self-pay | Admitting: Obstetrics and Gynecology

## 2016-05-07 ENCOUNTER — Encounter (HOSPITAL_COMMUNITY): Payer: Self-pay | Admitting: *Deleted

## 2016-05-08 DIAGNOSIS — Z3682 Encounter for antenatal screening for nuchal translucency: Secondary | ICD-10-CM | POA: Diagnosis not present

## 2016-05-08 DIAGNOSIS — Z3A12 12 weeks gestation of pregnancy: Secondary | ICD-10-CM | POA: Diagnosis not present

## 2016-05-08 DIAGNOSIS — Z3491 Encounter for supervision of normal pregnancy, unspecified, first trimester: Secondary | ICD-10-CM | POA: Diagnosis not present

## 2016-05-08 DIAGNOSIS — Z36 Encounter for antenatal screening for chromosomal anomalies: Secondary | ICD-10-CM | POA: Diagnosis not present

## 2016-05-12 MED ORDER — SODIUM CHLORIDE 0.9 % IJ SOLN
Freq: Once | INTRAMUSCULAR | Status: DC
  Filled 2016-05-12: qty 1

## 2016-05-12 NOTE — Consult Note (Signed)
Admission History and Physical Exam for an Obstetrics Patient  Ms. Brittney Anderson is a 30 y.o. female, G6P1131, at 13w 2d gestation, who presents for Placement of McDonald cerclage.. She has been followed at the [facility] of [facility].  Her pregnancy has been complicated by:  History of an incompetent cervix.   In 2006 the patient had preterm labor and preterm delivery at 23 weeks and 5 day gestation.  That infant did not survive.  In 2007 the patient had another pregnancy.  A cerclage was placed.  She received weekly injections of 17P. She carried a pregnancy to term. Her blood type is A+.  She received metronidazole for bacterial vaginosis during this pregnancy.  Group B strep was seen in her urine culture.  She was treated with penicillin.  See history below.          OB History    Gravida Para Term Preterm AB Living   6 2 1 1 3 1   SAB TAB Ectopic Multiple Live Births   1 2     2      Past Medical History:  Diagnosis Date  . Incompetent cervix   . Medical history non-contributory     No prescriptions prior to admission.         Past Surgical History:  Procedure Laterality Date  . CERVICAL CERCLAGE  2007  . NO PAST SURGERIES      No Known Allergies  Family History: family history is not on file.  Social History:  reports that she has quit smoking. Her smoking use included Cigars. She has a 0.50 pack-year smoking history. She has never used smokeless tobacco. She reports that she does not drink alcohol or use drugs.  Review of systems: Normal pregnancy complaints.  Admission Physical Exam:    Body mass index is 29.24 kg/m.  Height 5' 4.5" (1.638 m), weight 173 lb (78.5 kg).  HEENT:                 Within normal limits Chest:                   Clear Heart:                    Regular rate and rhythm Abdomen:             Gravid and nontender Extremities:          Grossly  normal Neurologic exam: Grossly normal Pelvic exam:         Cervix: Closed and long  Prenatal labs:  Blood type: O+ Antibody screen: Negative Rubella: Immune  Assessment:  13 week 2 day gestation Incompetent cervix History of preterm and nonviable labor and delivery  Plan:  We will plan to place a McDonald cerclage.  The patient understands the indication for her surgical procedure.  She understands the alternative treatment options.  She accepts the risk of, but not limited to, anesthetic complications, bleeding, infections, and possible damage to the surrounding organs.    Allanna Bresee V 05/12/2016, 3:53 PM  

## 2016-05-13 ENCOUNTER — Ambulatory Visit (HOSPITAL_COMMUNITY): Payer: 59 | Admitting: Anesthesiology

## 2016-05-13 ENCOUNTER — Ambulatory Visit (HOSPITAL_COMMUNITY)
Admission: RE | Admit: 2016-05-13 | Discharge: 2016-05-13 | Disposition: A | Discharge status: Home / Self Care | Payer: 59 | Source: Ambulatory Visit | Attending: Obstetrics and Gynecology | Admitting: Obstetrics and Gynecology

## 2016-05-13 ENCOUNTER — Encounter (HOSPITAL_COMMUNITY)
Admission: RE | Disposition: A | Discharge status: Home / Self Care | Payer: Self-pay | Source: Ambulatory Visit | Attending: Obstetrics and Gynecology

## 2016-05-13 ENCOUNTER — Encounter (HOSPITAL_COMMUNITY): Payer: Self-pay

## 2016-05-13 DIAGNOSIS — Z3A13 13 weeks gestation of pregnancy: Secondary | ICD-10-CM | POA: Insufficient documentation

## 2016-05-13 DIAGNOSIS — O3431 Maternal care for cervical incompetence, first trimester: Secondary | ICD-10-CM | POA: Insufficient documentation

## 2016-05-13 DIAGNOSIS — Z87891 Personal history of nicotine dependence: Secondary | ICD-10-CM | POA: Insufficient documentation

## 2016-05-13 DIAGNOSIS — N883 Incompetence of cervix uteri: Secondary | ICD-10-CM | POA: Diagnosis not present

## 2016-05-13 DIAGNOSIS — O021 Missed abortion: Secondary | ICD-10-CM | POA: Diagnosis not present

## 2016-05-13 HISTORY — PX: CERVICAL CERCLAGE: SHX1329

## 2016-05-13 LAB — CBC
HEMATOCRIT: 37.4 % (ref 36.0–46.0)
Hemoglobin: 12.8 g/dL (ref 12.0–15.0)
MCH: 29 pg (ref 26.0–34.0)
MCHC: 34.2 g/dL (ref 30.0–36.0)
MCV: 84.6 fL (ref 78.0–100.0)
Platelets: 284 10*3/uL (ref 150–400)
RBC: 4.42 MIL/uL (ref 3.87–5.11)
RDW: 13.4 % (ref 11.5–15.5)
WBC: 6.1 10*3/uL (ref 4.0–10.5)

## 2016-05-13 SURGERY — CERCLAGE, CERVIX, VAGINAL APPROACH
Anesthesia: Monitor Anesthesia Care | Site: Cervix

## 2016-05-13 MED ORDER — FENTANYL CITRATE (PF) 100 MCG/2ML IJ SOLN
INTRAMUSCULAR | Status: AC
  Filled 2016-05-13: qty 2

## 2016-05-13 MED ORDER — FENTANYL CITRATE (PF) 100 MCG/2ML IJ SOLN
INTRAMUSCULAR | Status: DC | PRN
  Administered 2016-05-13: 100 ug via INTRAVENOUS

## 2016-05-13 MED ORDER — PHENYLEPHRINE HCL 10 MG/ML IJ SOLN
INTRAMUSCULAR | Status: DC | PRN
  Administered 2016-05-13: 80 ug via INTRAVENOUS

## 2016-05-13 MED ORDER — CLINDAMYCIN PHOSPHATE 300 MG/2ML IJ SOLN
Freq: Once | INTRAMUSCULAR | Status: AC
  Administered 2016-05-13: 30 mL via VAGINAL
  Filled 2016-05-13: qty 1

## 2016-05-13 MED ORDER — HYDROMORPHONE HCL 1 MG/ML IJ SOLN: 0.2500 mg | INTRAMUSCULAR | Status: DC | PRN

## 2016-05-13 MED ORDER — IBUPROFEN 800 MG PO TABS: 800.0000 mg | ORAL_TABLET | Freq: Three times a day (TID) | ORAL | 1 refills | Status: DC | PRN

## 2016-05-13 MED ORDER — IBUPROFEN 800 MG PO TABS
800.0000 mg | ORAL_TABLET | Freq: Once | ORAL | Status: AC
  Administered 2016-05-13: 800 mg via ORAL

## 2016-05-13 MED ORDER — PHENYLEPHRINE 40 MCG/ML (10ML) SYRINGE FOR IV PUSH (FOR BLOOD PRESSURE SUPPORT)
PREFILLED_SYRINGE | INTRAVENOUS | Status: AC
  Filled 2016-05-13: qty 10

## 2016-05-13 MED ORDER — LACTATED RINGERS IV SOLN
INTRAVENOUS | Status: DC
  Administered 2016-05-13: 125 mL/h via INTRAVENOUS
  Administered 2016-05-13: 13:00:00 via INTRAVENOUS

## 2016-05-13 MED ORDER — IBUPROFEN 800 MG PO TABS
ORAL_TABLET | ORAL | Status: AC
  Filled 2016-05-13: qty 1

## 2016-05-13 MED ORDER — PROMETHAZINE HCL 25 MG/ML IJ SOLN: 6.2500 mg | INTRAMUSCULAR | Status: DC | PRN

## 2016-05-13 MED ORDER — BUPIVACAINE IN DEXTROSE 0.75-8.25 % IT SOLN
INTRATHECAL | Status: DC | PRN
  Administered 2016-05-13: 1.6 mL via INTRATHECAL

## 2016-05-13 SURGICAL SUPPLY — 20 items
CANISTER SUCT 3000ML (MISCELLANEOUS) ×4 IMPLANT
CLOTH BEACON ORANGE TIMEOUT ST (SAFETY) ×2 IMPLANT
COUNTER NEEDLE 1200 MAGNETIC (NEEDLE) ×2 IMPLANT
GLOVE BIOGEL PI IND STRL 7.0 (GLOVE) ×1 IMPLANT
GLOVE BIOGEL PI IND STRL 8.5 (GLOVE) ×1 IMPLANT
GLOVE BIOGEL PI INDICATOR 7.0 (GLOVE) ×1
GLOVE BIOGEL PI INDICATOR 8.5 (GLOVE) ×1
GLOVE ECLIPSE 8.0 STRL XLNG CF (GLOVE) ×4 IMPLANT
GOWN STRL REUS W/TWL LRG LVL3 (GOWN DISPOSABLE) ×4 IMPLANT
NEEDLE MA TROC 1/2 (NEEDLE) ×2 IMPLANT
NEEDLE MAYO CATGUT SZ4 (NEEDLE) ×2 IMPLANT
PACK VAGINAL MINOR WOMEN LF (CUSTOM PROCEDURE TRAY) ×2 IMPLANT
PAD OB MATERNITY 4.3X12.25 (PERSONAL CARE ITEMS) ×2 IMPLANT
PAD PREP 24X48 CUFFED NSTRL (MISCELLANEOUS) ×2 IMPLANT
SUT MERSILENE 5MM BP 1 12 (SUTURE) ×2 IMPLANT
TOWEL OR 17X24 6PK STRL BLUE (TOWEL DISPOSABLE) ×4 IMPLANT
TRAY FOLEY CATH SILVER 14FR (SET/KITS/TRAYS/PACK) ×2 IMPLANT
TUBING NON-CON 1/4 X 20 CONN (TUBING) IMPLANT
WATER STERILE IRR 1000ML POUR (IV SOLUTION) ×2 IMPLANT
YANKAUER SUCT BULB TIP NO VENT (SUCTIONS) IMPLANT

## 2016-05-13 NOTE — Op Note (Signed)
OPERATIVE NOTE  Brittney Anderson  DOB:    06-12-1986  MRN:    [id number]  CSN:    [id number]  Date of Surgery:  05/13/2016   Preoperative Diagnosis:  13 week 2 day gestation  Incompetent cervix  Postoperative Diagnosis:  Same  Procedure:  McDonald cerclage  Surgeon:  Leonard SchwartzArthur Vernon Sammi Stolarz, M.D.  Assistant:  None  Anesthetic:  Spinal  Disposition:  The patient presents with a history of an incompetent cervix. She understands the indications for her surgical procedure. She understands her alternative treatment options. She accepts the risk of, but not limited to, anesthetic complications, bleeding, infections, and possible damage to the surrounding organs.  Findings:  The uterus was 13 weeks size. The cervix was long and closed. Blood type A+.  Procedure:  The patient was taken to the operating room where a spinal anesthetic was given. The perineum and vagina were prepped with multiple layers of Betadine. A Foley catheter was placed in the bladder. The patient was sterilely draped. An examination under anesthesia was performed. The cervix was noted to be closed and long. Ring forceps were placed on the cervix. A circumferential stitch of #5 Mersilene was placed at the level of the endocervix. The knot was tied in the posterior vagina. Care was taken not to damage the bladder or the fetal membranes. The patient tolerated the procedure well. Hemostasis was adequate. Sponge and needle counts were correct. The estimated blood loss was 5 cc's. The patient was returned to the supine position. She was transported to the recovery room in stable condition.  Followup instructions:  The patient will maintain modified bed rest for one week. She will then have a light activity for the following week. We will consider allowing her to go back to work after 2 weeks. She will return for an office visit in 2 weeks. She will call for questions or concerns.  Discharge  medications:  Ibuprofen 800 mg every 8 hours as needed for cramping after continuous ibuprofen 800 mg every 8 hours for 48 hours.  Leonard SchwartzArthur Vernon Zakariah Dejarnette, M.D. May 13, 2016 12:28 PM

## 2016-05-13 NOTE — H&P (Signed)
Admission History and Physical Exam for an Obstetrics Patient  Ms. Brittney Anderson is a 30 y.o. female, [id number], at 13w 2d gestation, who presents for Placement of McDonald cerclage.. [facility].  Her pregnancy has been complicated by:  History of an incompetent cervix.   In 2006 the patient had preterm labor and preterm delivery at 23 weeks and 5 day gestation.  That infant did not survive.  In 2007 the patient had another pregnancy.  A cerclage was placed.  She received weekly injections of 17P. She carried a pregnancy to term. Her blood type is A+.  She received metronidazole for bacterial vaginosis during this pregnancy.  Group B strep was seen in her urine culture.  She was treated with penicillin.  See history below.          OB History    Gravida Para Term Preterm AB Living   6 2 1 1 3 1    SAB TAB Ectopic Multiple Live Births   1 2     2       Past Medical History:  Diagnosis Date  . Incompetent cervix   . Medical history non-contributory     No prescriptions prior to admission.         Past Surgical History:  Procedure Laterality Date  . CERVICAL CERCLAGE  2007  . NO PAST SURGERIES      No Known Allergies  Family History: family history is not on file.  Social History:  reports that she has quit smoking. Her smoking use included Cigars. She has a 0.50 pack-year smoking history. She has never used smokeless tobacco. She reports that she does not drink alcohol or use drugs.  Review of systems: Normal pregnancy complaints.  Admission Physical Exam:    Body mass index is 29.24 kg/m.  Height 5' 4.5" (1.638 m), weight 173 lb (78.5 kg).  HEENT:                 Within normal limits Chest:                   Clear Heart:                    Regular rate and rhythm Abdomen:             Gravid and nontender Extremities:          Grossly  normal Neurologic exam: Grossly normal Pelvic exam:         Cervix: Closed and long  Prenatal labs:  Blood type: O+ Antibody screen: Negative Rubella: Immune  Assessment:  13 week 2 day gestation Incompetent cervix History of preterm and nonviable labor and delivery  Plan:  We will plan to place a McDonald cerclage.  The patient understands the indication for her surgical procedure.  She understands the alternative treatment options.  She accepts the risk of, but not limited to, anesthetic complications, bleeding, infections, and possible damage to the surrounding organs.    Janine LimboSTRINGER,Brittney Anderson 05/12/2016, 3:53 PM

## 2016-05-13 NOTE — Discharge Instructions (Signed)
Post Anesthesia Home Care Instructions  Activity: Get plenty of rest for the remainder of the day. A responsible adult should stay with you for 24 hours following the procedure.  For the next 24 hours, DO NOT: -Drive a car -Advertising copywriterperate machinery -Drink alcoholic beverages -Take any medication unless instructed by your physician -Make any legal decisions or sign important papers.  Meals: Start with liquid foods such as gelatin or soup. Progress to regular foods as tolerated. Avoid greasy, spicy, heavy foods. If nausea and/or vomiting occur, drink only clear liquids until the nausea and/or vomiting subsides. Call your physician if vomiting continues.  Special Instructions/Symptoms: Your throat may feel dry or sore from the anesthesia or the breathing tube placed in your throat during surgery. If this causes discomfort, gargle with warm salt water. The discomfort should disappear within 24 hours.  If you had a scopolamine patch placed behind your ear for the management of post- operative nausea and/or vomiting:  1. The medication in the patch is effective for 72 hours, after which it should be removed.  Wrap patch in a tissue and discard in the trash. Wash hands thoroughly with soap and water. 2. You may remove the patch earlier than 72 hours if you experience unpleasant side effects which may include dry mouth, dizziness or visual disturbances. 3. Avoid touching the patch. Wash your hands with soap and water after contact with the patch.   Cervical Cerclage, Care After This sheet gives you information about how to care for yourself after your procedure. Your health care provider may also give you more specific instructions. If you have problems or questions, contact your health care provider. What can I expect after the procedure? After your procedure, it is common to have:  Cramping in your abdomen.  Mucus discharge for several days.  Painful urination (dysuria).  Small drops of blood  coming from your vagina (spotting). Follow these instructions at home:  Follow instructions from your health care provider about bed rest, if this applies. You may need to be on bed rest for up to 3 days.  Take over-the-counter and prescription medicines only as told by your health care provider.  Do not drive or use heavy machinery while taking prescription pain medicine.  Keep track of your vaginal discharge and watch for any changes. If you notice changes, tell your health care provider.  Avoid physical activities and exercise until your health care provider approves. Ask your health care provider what activities are safe for you.  Until your health care provider approves:  Do not douche.  Do not have sexual intercourse.  Keep all pre-birth (prenatal) visits and all follow-up visits as told by your health care provider. This is important. You will probably have weekly visits to have your cervix checked, and you may need an ultrasound. Contact a health care provider if:  You have abnormal or bad-smelling vaginal discharge, such as clots.  You develop a rash on your skin. This may look like redness and swelling.  You become light-headed or feel like you are going to faint.  You have abdominal pain that does not get better with medicine.  You have persistent nausea or vomiting. Get help right away if:  You have vaginal bleeding that is heavier or more frequent than spotting.  You are leaking fluid or have a gush of fluid from your vagina (your water breaks).  You have a fever or chills.  You faint.  You have uterine contractions. These may feel like:  A back ache.  Lower abdominal pain.  Mild cramps, similar to menstrual cramps.  Tightening or pressure in your abdomen.  You think that your baby is not moving as much as usual, or you cannot feel your baby move.  You have chest pain.  You have shortness of breath. This information is not intended to replace  advice given to you by your health care provider. Make sure you discuss any questions you have with your health care provider. Document Released: 03/29/2013 Document Revised: 02/05/2016 Document Reviewed: 01/10/2016 Elsevier Interactive Patient Education  2017 [employer]

## 2016-05-13 NOTE — Anesthesia Preprocedure Evaluation (Signed)
Anesthesia Evaluation  Patient identified by MRN, date of birth, ID band Patient awake    Reviewed: Allergy & Precautions, NPO status , Patient's Chart, lab work & pertinent test results  Airway Mallampati: II  TM Distance: >3 FB Neck ROM: Full    Dental no notable dental hx. (+) Dental Advisory Given   Pulmonary former smoker,    Pulmonary exam normal        Cardiovascular negative cardio ROS Normal cardiovascular exam     Neuro/Psych negative neurological ROS  negative psych ROS   GI/Hepatic negative GI ROS, Neg liver ROS,   Endo/Other  negative endocrine ROS  Renal/GU negative Renal ROS  negative genitourinary   Musculoskeletal negative musculoskeletal ROS (+)   Abdominal   Peds negative pediatric ROS (+)  Hematology negative hematology ROS (+)   Anesthesia Other Findings   Reproductive/Obstetrics (+) Pregnancy                             Anesthesia Physical Anesthesia Plan  ASA: II  Anesthesia Plan: MAC and Spinal   Post-op Pain Management:    Induction:   Airway Management Planned: Natural Airway  Additional Equipment:   Intra-op Plan:   Post-operative Plan:   Informed Consent: I have reviewed the patients History and Physical, chart, labs and discussed the procedure including the risks, benefits and alternatives for the proposed anesthesia with the patient or authorized representative who has indicated his/her understanding and acceptance.   Dental advisory given  Plan Discussed with: CRNA and Anesthesiologist  Anesthesia Plan Comments:         Anesthesia Quick Evaluation

## 2016-05-13 NOTE — Anesthesia Procedure Notes (Signed)
Spinal  Patient location during procedure: OR Start time: 05/13/2016 11:45 AM End time: 05/13/2016 11:50 AM Staffing Anesthesiologist: Heather RobertsSINGER, JAMES Performed: anesthesiologist  Preanesthetic Checklist Completed: patient identified, site marked, surgical consent, pre-op evaluation, timeout performed, IV checked, risks and benefits discussed and monitors and equipment checked Spinal Block Patient position: sitting Prep: DuraPrep Patient monitoring: heart rate, cardiac monitor, continuous pulse ox and blood pressure Location: L3-4 Needle Needle type: Sprotte  Needle gauge: 24 G Needle length: 10 cm Assessment Sensory level: T10

## 2016-05-13 NOTE — Anesthesia Postprocedure Evaluation (Signed)
Anesthesia Post Note  Patient: Brittney Anderson  Procedure(s) Performed: Procedure(s) (LRB): CERCLAGE CERVICAL (N/A)  Patient location during evaluation: PACU Anesthesia Type: Spinal and MAC Level of consciousness: awake and alert Pain management: pain level controlled Vital Signs Assessment: post-procedure vital signs reviewed and stable Respiratory status: spontaneous breathing and respiratory function stable Cardiovascular status: blood pressure returned to baseline and stable Postop Assessment: spinal receding Anesthetic complications: no     Last Vitals:  Vitals:   05/13/16 1245 05/13/16 1315  BP: 101/63 98/69  Pulse: 61 (!) 52  Resp: 14 16  Temp:      Last Pain:  Vitals:   05/13/16 1059  TempSrc: Oral   Pain Goal: Patients Stated Pain Goal: 5 (05/13/16 1059)               Caide Campi DANIEL

## 2016-05-13 NOTE — Transfer of Care (Signed)
Immediate Anesthesia Transfer of Care Note  Patient: Brittney Anderson  Procedure(s) Performed: Procedure(s): CERCLAGE CERVICAL (N/A)  Patient Location: PACU  Anesthesia Type:Spinal  Level of Consciousness: awake, alert  and oriented  Airway & Oxygen Therapy: Patient Spontanous Breathing  Post-op Assessment: Report given to RN and Post -op Vital signs reviewed and stable  Post vital signs: Reviewed and stable  Last Vitals:  Vitals:   05/13/16 1059  BP: 112/70  Pulse: 67  Resp: 16  Temp: 36.8 C    Last Pain:  Vitals:   05/13/16 1059  TempSrc: Oral      Patients Stated Pain Goal: 5 (05/13/16 1059)  Complications: No apparent anesthesia complications

## 2016-05-13 NOTE — Progress Notes (Signed)
The patient was interviewed and examined today.  The previously documented history and physical examination was reviewed. There are no changes. The operative procedure was reviewed. The risks and benefits were outlined again. The specific risks include, but are not limited to, anesthetic complications, bleeding, infections, and possible damage to the surrounding organs. The patient's questions were answered.  We are ready to proceed as outlined. The likelihood of the patient achieving the goals of this procedure is very likely.   BP 112/70   Pulse 67   Temp 98.2 F (36.8 C) (Oral)   Resp 16   Ht 5' 4.5" (1.638 m)   Wt 173 lb (78.5 kg)   SpO2 100%   BMI 29.24 kg/m   Leonard SchwartzArthur Vernon Randolf Sansoucie, M.D.

## 2016-05-18 ENCOUNTER — Encounter (HOSPITAL_COMMUNITY): Payer: Self-pay | Admitting: Obstetrics and Gynecology

## 2016-05-19 DIAGNOSIS — R300 Dysuria: Secondary | ICD-10-CM | POA: Diagnosis not present

## 2016-06-04 DIAGNOSIS — Z1371 Encounter for nonprocreative screening for genetic disease carrier status: Secondary | ICD-10-CM | POA: Diagnosis not present

## 2016-06-04 DIAGNOSIS — Z3482 Encounter for supervision of other normal pregnancy, second trimester: Secondary | ICD-10-CM | POA: Diagnosis not present

## 2016-06-04 DIAGNOSIS — Z8751 Personal history of pre-term labor: Secondary | ICD-10-CM | POA: Diagnosis not present

## 2016-06-04 DIAGNOSIS — N883 Incompetence of cervix uteri: Secondary | ICD-10-CM | POA: Diagnosis not present

## 2016-06-04 DIAGNOSIS — Z9189 Other specified personal risk factors, not elsewhere classified: Secondary | ICD-10-CM | POA: Diagnosis not present

## 2016-06-04 DIAGNOSIS — Z3A16 16 weeks gestation of pregnancy: Secondary | ICD-10-CM | POA: Diagnosis not present

## 2016-06-09 MED FILL — MAKENA 250 MG/ML VIAL: 250 | 28 days supply | Qty: 4 | Fill #0

## 2016-06-11 DIAGNOSIS — Z8751 Personal history of pre-term labor: Secondary | ICD-10-CM | POA: Diagnosis not present

## 2016-06-19 DIAGNOSIS — Z3A18 18 weeks gestation of pregnancy: Secondary | ICD-10-CM | POA: Diagnosis not present

## 2016-06-19 DIAGNOSIS — Z8751 Personal history of pre-term labor: Secondary | ICD-10-CM | POA: Diagnosis not present

## 2016-06-22 NOTE — L&D Delivery Note (Addendum)
Delivery Note 0024: Nurse call reports patient with c/o rectal pressure.  VE: C/C/+2.  In room to assess.  FHR reassuring.  Room prepped for delivery.  NICU team called per hospital protocol.  Patient delivered, as below, with staff and family support.   At 12:44 AM, on May 17th, 2018, a viable female "Brittney Anderson" was delivered via Vaginal, Spontaneous Delivery (Presentation:Left Occiput Anterior with restitution to LOT). Shoulders delivered easily and infant with good tone and spontaneous cry. Tactile stimulation given by provider and infant given to nurse and tactile stimulation continued until cord cut at 1 minute of life.  Infant handed to NICU staff and APGAR: 9, 9.  Cord blood collected. Placenta delivered spontaneously and noted to be intact with 3VC upon inspection.  Placenta to pathology for IUGR diagnosis.  Vaginal inspection revealed a right side labial laceration that was repaired with 3-0 vicryl on SH.  Additional anesthetic necessary and patient tolerated the procedure well. Fundus firm, at the umbilicus, and bleeding small.  Mother hemodynamically stable and infant skin to skin prior to provider exit.  Mother desires birth control and opts to breastfeed.  Infant weight: 4 lb 12.4 oz (2166 g).    Anesthesia:  Epidural and Local for repair Episiotomy: None Lacerations: Labial Suture Repair: 3.0 vicryl Est. Blood Loss (mL): 100  Mom to postpartum.  Baby to Couplet care / Skin to Skin.  Brittney RobinsJessica L Jushua Waltman MSN, CNM 11/05/2016, 1:26 AM  Addendum Nurse call reports placenta was disposed of instead of sent to pathology.

## 2016-06-30 DIAGNOSIS — Z369 Encounter for antenatal screening, unspecified: Secondary | ICD-10-CM | POA: Diagnosis not present

## 2016-06-30 DIAGNOSIS — Z3482 Encounter for supervision of other normal pregnancy, second trimester: Secondary | ICD-10-CM | POA: Diagnosis not present

## 2016-06-30 DIAGNOSIS — Z3A29 29 weeks gestation of pregnancy: Secondary | ICD-10-CM | POA: Diagnosis not present

## 2016-06-30 DIAGNOSIS — N883 Incompetence of cervix uteri: Secondary | ICD-10-CM | POA: Diagnosis not present

## 2016-06-30 DIAGNOSIS — N760 Acute vaginitis: Secondary | ICD-10-CM | POA: Diagnosis not present

## 2016-07-01 MED FILL — metroNIDAZOLE 500 MG TABS: 500 | 7 days supply | Qty: 14 | Fill #0

## 2016-07-06 MED FILL — MAKENA 250 MG/ML VIAL: 250 | 28 days supply | Qty: 4 | Fill #1

## 2016-07-16 DIAGNOSIS — Z3A22 22 weeks gestation of pregnancy: Secondary | ICD-10-CM | POA: Diagnosis not present

## 2016-07-16 DIAGNOSIS — N883 Incompetence of cervix uteri: Secondary | ICD-10-CM | POA: Diagnosis not present

## 2016-07-16 DIAGNOSIS — Z8751 Personal history of pre-term labor: Secondary | ICD-10-CM | POA: Diagnosis not present

## 2016-07-30 DIAGNOSIS — Z3A24 24 weeks gestation of pregnancy: Secondary | ICD-10-CM | POA: Diagnosis not present

## 2016-07-30 DIAGNOSIS — Z8751 Personal history of pre-term labor: Secondary | ICD-10-CM | POA: Diagnosis not present

## 2016-07-30 DIAGNOSIS — N760 Acute vaginitis: Secondary | ICD-10-CM | POA: Diagnosis not present

## 2016-07-30 DIAGNOSIS — Z3492 Encounter for supervision of normal pregnancy, unspecified, second trimester: Secondary | ICD-10-CM | POA: Diagnosis not present

## 2016-08-03 MED FILL — MAKENA 250 MG/ML VIAL: 250 | 28 days supply | Qty: 4 | Fill #2

## 2016-08-03 MED FILL — metroNIDAZOLE 500 MG TABS: 500 | 7 days supply | Qty: 14 | Fill #0

## 2016-08-07 DIAGNOSIS — Z3A25 25 weeks gestation of pregnancy: Secondary | ICD-10-CM | POA: Diagnosis not present

## 2016-08-07 DIAGNOSIS — Z8751 Personal history of pre-term labor: Secondary | ICD-10-CM | POA: Diagnosis not present

## 2016-08-07 DIAGNOSIS — N883 Incompetence of cervix uteri: Secondary | ICD-10-CM | POA: Diagnosis not present

## 2016-08-07 DIAGNOSIS — Z3492 Encounter for supervision of normal pregnancy, unspecified, second trimester: Secondary | ICD-10-CM | POA: Diagnosis not present

## 2016-08-14 DIAGNOSIS — Z3A26 26 weeks gestation of pregnancy: Secondary | ICD-10-CM | POA: Diagnosis not present

## 2016-08-14 DIAGNOSIS — Z3492 Encounter for supervision of normal pregnancy, unspecified, second trimester: Secondary | ICD-10-CM | POA: Diagnosis not present

## 2016-08-14 DIAGNOSIS — Z8751 Personal history of pre-term labor: Secondary | ICD-10-CM | POA: Diagnosis not present

## 2016-08-26 DIAGNOSIS — Z8751 Personal history of pre-term labor: Secondary | ICD-10-CM | POA: Diagnosis not present

## 2016-08-26 DIAGNOSIS — Z3A28 28 weeks gestation of pregnancy: Secondary | ICD-10-CM | POA: Diagnosis not present

## 2016-08-26 DIAGNOSIS — Z369 Encounter for antenatal screening, unspecified: Secondary | ICD-10-CM | POA: Diagnosis not present

## 2016-08-26 DIAGNOSIS — O3433 Maternal care for cervical incompetence, third trimester: Secondary | ICD-10-CM | POA: Diagnosis not present

## 2016-08-27 MED FILL — MAKENA 250 MG/ML VIAL: 250 | 28 days supply | Qty: 4 | Fill #3

## 2016-09-02 DIAGNOSIS — Z3493 Encounter for supervision of normal pregnancy, unspecified, third trimester: Secondary | ICD-10-CM | POA: Diagnosis not present

## 2016-09-02 DIAGNOSIS — Z3A29 29 weeks gestation of pregnancy: Secondary | ICD-10-CM | POA: Diagnosis not present

## 2016-09-02 DIAGNOSIS — Z3A33 33 weeks gestation of pregnancy: Secondary | ICD-10-CM | POA: Diagnosis not present

## 2016-09-02 DIAGNOSIS — O3433 Maternal care for cervical incompetence, third trimester: Secondary | ICD-10-CM | POA: Diagnosis not present

## 2016-09-02 DIAGNOSIS — Z8751 Personal history of pre-term labor: Secondary | ICD-10-CM | POA: Diagnosis not present

## 2016-09-02 DIAGNOSIS — N883 Incompetence of cervix uteri: Secondary | ICD-10-CM | POA: Diagnosis not present

## 2016-09-07 ENCOUNTER — Other Ambulatory Visit: Payer: Self-pay | Admitting: Obstetrics and Gynecology

## 2016-09-10 ENCOUNTER — Encounter (HOSPITAL_COMMUNITY): Payer: Self-pay | Admitting: *Deleted

## 2016-09-10 ENCOUNTER — Inpatient Hospital Stay (HOSPITAL_COMMUNITY)
Admission: AD | Admit: 2016-09-10 | Discharge: 2016-09-10 | Disposition: A | Discharge status: Home / Self Care | Payer: 59 | Source: Ambulatory Visit | Attending: Obstetrics & Gynecology | Admitting: Obstetrics & Gynecology

## 2016-09-10 DIAGNOSIS — Z3A31 31 weeks gestation of pregnancy: Secondary | ICD-10-CM | POA: Insufficient documentation

## 2016-09-10 DIAGNOSIS — Z3493 Encounter for supervision of normal pregnancy, unspecified, third trimester: Secondary | ICD-10-CM | POA: Insufficient documentation

## 2016-09-10 MED ORDER — BETAMETHASONE SOD PHOS & ACET 6 (3-3) MG/ML IJ SUSP
12.0000 mg | INTRAMUSCULAR | Status: DC
  Administered 2016-09-10: 12 mg via INTRAMUSCULAR
  Filled 2016-09-10 (×2): qty 2

## 2016-09-10 NOTE — MAU Note (Signed)
Pt presents for 1st  Dose of betamethasone injection. Cerclage placed in first trimester for incompetent cervix

## 2016-09-11 ENCOUNTER — Inpatient Hospital Stay (HOSPITAL_COMMUNITY)
Admission: AD | Admit: 2016-09-11 | Discharge: 2016-09-11 | Disposition: A | Discharge status: Home / Self Care | Payer: 59 | Source: Ambulatory Visit | Attending: Obstetrics and Gynecology | Admitting: Obstetrics and Gynecology

## 2016-09-11 DIAGNOSIS — Z3493 Encounter for supervision of normal pregnancy, unspecified, third trimester: Secondary | ICD-10-CM | POA: Insufficient documentation

## 2016-09-11 DIAGNOSIS — Z3A31 31 weeks gestation of pregnancy: Secondary | ICD-10-CM | POA: Insufficient documentation

## 2016-09-11 MED ORDER — BETAMETHASONE SOD PHOS & ACET 6 (3-3) MG/ML IJ SUSP
12.0000 mg | Freq: Once | INTRAMUSCULAR | Status: DC
  Administered 2016-09-11: 12 mg via INTRAMUSCULAR
  Filled 2016-09-11: qty 2

## 2016-09-11 MED ORDER — BETAMETHASONE SOD PHOS & ACET 6 (3-3) MG/ML IJ SUSP
12.0000 mg | Freq: Once | INTRAMUSCULAR | Status: DC
  Filled 2016-09-11: qty 2

## 2016-09-11 NOTE — MAU Note (Signed)
Patient here for 2nd betamethasone injection. Denies any complaints at this time.

## 2016-09-16 DIAGNOSIS — O358XX0 Maternal care for other (suspected) fetal abnormality and damage, not applicable or unspecified: Secondary | ICD-10-CM | POA: Diagnosis not present

## 2016-09-16 DIAGNOSIS — Z3A31 31 weeks gestation of pregnancy: Secondary | ICD-10-CM | POA: Diagnosis not present

## 2016-09-16 DIAGNOSIS — Z8751 Personal history of pre-term labor: Secondary | ICD-10-CM | POA: Diagnosis not present

## 2016-09-16 DIAGNOSIS — N883 Incompetence of cervix uteri: Secondary | ICD-10-CM | POA: Diagnosis not present

## 2016-09-17 MED FILL — MAKENA 250 MG/ML VIAL: 250 | 28 days supply | Qty: 4 | Fill #4

## 2016-09-23 DIAGNOSIS — Z3A32 32 weeks gestation of pregnancy: Secondary | ICD-10-CM | POA: Diagnosis not present

## 2016-09-23 DIAGNOSIS — O358XX0 Maternal care for other (suspected) fetal abnormality and damage, not applicable or unspecified: Secondary | ICD-10-CM | POA: Diagnosis not present

## 2016-09-23 DIAGNOSIS — N883 Incompetence of cervix uteri: Secondary | ICD-10-CM | POA: Diagnosis not present

## 2016-09-23 DIAGNOSIS — Z3483 Encounter for supervision of other normal pregnancy, third trimester: Secondary | ICD-10-CM | POA: Diagnosis not present

## 2016-10-02 DIAGNOSIS — N883 Incompetence of cervix uteri: Secondary | ICD-10-CM | POA: Diagnosis not present

## 2016-10-02 DIAGNOSIS — Z3483 Encounter for supervision of other normal pregnancy, third trimester: Secondary | ICD-10-CM | POA: Diagnosis not present

## 2016-10-02 DIAGNOSIS — Z8751 Personal history of pre-term labor: Secondary | ICD-10-CM | POA: Diagnosis not present

## 2016-10-02 DIAGNOSIS — O358XX0 Maternal care for other (suspected) fetal abnormality and damage, not applicable or unspecified: Secondary | ICD-10-CM | POA: Diagnosis not present

## 2016-10-02 DIAGNOSIS — O3433 Maternal care for cervical incompetence, third trimester: Secondary | ICD-10-CM | POA: Diagnosis not present

## 2016-10-02 DIAGNOSIS — Z3A33 33 weeks gestation of pregnancy: Secondary | ICD-10-CM | POA: Diagnosis not present

## 2016-10-07 ENCOUNTER — Other Ambulatory Visit (HOSPITAL_COMMUNITY): Payer: Self-pay | Admitting: Certified Nurse Midwife

## 2016-10-07 DIAGNOSIS — O365931 Maternal care for other known or suspected poor fetal growth, third trimester, fetus 1: Secondary | ICD-10-CM

## 2016-10-07 DIAGNOSIS — Z3689 Encounter for other specified antenatal screening: Secondary | ICD-10-CM

## 2016-10-07 DIAGNOSIS — O3433 Maternal care for cervical incompetence, third trimester: Secondary | ICD-10-CM

## 2016-10-07 DIAGNOSIS — Z3A35 35 weeks gestation of pregnancy: Secondary | ICD-10-CM

## 2016-10-08 DIAGNOSIS — Z3A34 34 weeks gestation of pregnancy: Secondary | ICD-10-CM | POA: Diagnosis not present

## 2016-10-08 DIAGNOSIS — O358XX0 Maternal care for other (suspected) fetal abnormality and damage, not applicable or unspecified: Secondary | ICD-10-CM | POA: Diagnosis not present

## 2016-10-08 DIAGNOSIS — Z3483 Encounter for supervision of other normal pregnancy, third trimester: Secondary | ICD-10-CM | POA: Diagnosis not present

## 2016-10-13 ENCOUNTER — Ambulatory Visit (HOSPITAL_COMMUNITY)
Admission: RE | Admit: 2016-10-13 | Discharge: 2016-10-13 | Disposition: A | Discharge status: Home / Self Care | Payer: 59 | Source: Ambulatory Visit | Attending: Certified Nurse Midwife | Admitting: Certified Nurse Midwife

## 2016-10-13 ENCOUNTER — Encounter (HOSPITAL_COMMUNITY): Payer: Self-pay

## 2016-10-13 ENCOUNTER — Other Ambulatory Visit (HOSPITAL_COMMUNITY): Payer: Self-pay | Admitting: Certified Nurse Midwife

## 2016-10-13 VITALS — BP 110/64 | HR 76 | Wt 189.4 lb

## 2016-10-13 DIAGNOSIS — Z3689 Encounter for other specified antenatal screening: Secondary | ICD-10-CM

## 2016-10-13 DIAGNOSIS — O3433 Maternal care for cervical incompetence, third trimester: Secondary | ICD-10-CM | POA: Diagnosis not present

## 2016-10-13 DIAGNOSIS — O09893 Supervision of other high risk pregnancies, third trimester: Secondary | ICD-10-CM

## 2016-10-13 DIAGNOSIS — Z3A35 35 weeks gestation of pregnancy: Secondary | ICD-10-CM | POA: Diagnosis not present

## 2016-10-13 DIAGNOSIS — O365931 Maternal care for other known or suspected poor fetal growth, third trimester, fetus 1: Secondary | ICD-10-CM

## 2016-10-13 DIAGNOSIS — O09213 Supervision of pregnancy with history of pre-term labor, third trimester: Secondary | ICD-10-CM | POA: Diagnosis not present

## 2016-10-13 DIAGNOSIS — O365990 Maternal care for other known or suspected poor fetal growth, unspecified trimester, not applicable or unspecified: Secondary | ICD-10-CM

## 2016-10-13 DIAGNOSIS — O365930 Maternal care for other known or suspected poor fetal growth, third trimester, not applicable or unspecified: Secondary | ICD-10-CM | POA: Insufficient documentation

## 2016-10-13 NOTE — Consult Note (Signed)
MFM consult  31 yr old [id number] at [redacted]w[redacted]d with history of preterm delivery referred by Dr. Stefano Gaul for fetal ultrasound and consult secondary to finding of growth restriction on outside ultrasound.  Past OB hx: 2006 preterm delivery at 23 weeks- neonatal demise 2007 full term SVD; had history indicated cerclage; baby 6lbs 2 TABs; 1 SAB  No other significant history   Ultrasound today shows: single intrauterine pregnancy. Estimated fetal weight is in the <10th%. Anterior placenta without evidence of previa. Normal amniotic fluid index.  The anatomy survey is limited as above by advanced gestational age; no abnormalities seen. Normal umbilical artery Doppler studies. Normal biophysical profile of 8/8.  I counseled the patient as follows:  1. Fetal growth restriction: - dated by LMP c/w 7 week ultrasound - discussed may be constitutional, genetic condition (patient did not have aneuploidy screening), metabolic, congenital infection- normal limited anatomy survey today, no ultrasound markers of congenital infection however all the above remain in the differential - discussed increased risk of stillbirth and adverse outcomes with growth restriction - discussed it is somewhat reassuring that the AFI, Doppler studies, and BPP are normal - recommend repeat fetal growth in 2 weeks - recommend weekly Doppler studies - recommend antenatal testing - recommend fetal kick counts - recommend delivery at 38 weeks if remains uncomplicated (reassuring fetal testing, normal AFI, normal Doppler studies, and appropriate interval growth) or sooner as clinically indicated 2. Normal limited anatomy survey 3. History of preterm delivery: - has history indicated cerclage- recommend remove at 37 weeks or sooner if indicated - on makena - recommend preterm labor precautions  I spent a total of 30 minutes with the patient of which >50% was in face to face consultation.  Eulis Foster, MD

## 2016-10-14 ENCOUNTER — Other Ambulatory Visit (HOSPITAL_COMMUNITY): Payer: Self-pay | Admitting: *Deleted

## 2016-10-14 DIAGNOSIS — O365930 Maternal care for other known or suspected poor fetal growth, third trimester, not applicable or unspecified: Secondary | ICD-10-CM

## 2016-10-19 MED FILL — PRENATAL VITAMIN PLUS LOW I: 27-1 | 30 days supply | Qty: 30 | Fill #0

## 2016-10-20 ENCOUNTER — Ambulatory Visit (HOSPITAL_COMMUNITY)
Admission: RE | Admit: 2016-10-20 | Discharge: 2016-10-20 | Disposition: A | Discharge status: Home / Self Care | Payer: 59 | Source: Ambulatory Visit | Attending: Certified Nurse Midwife | Admitting: Certified Nurse Midwife

## 2016-10-20 ENCOUNTER — Other Ambulatory Visit (HOSPITAL_COMMUNITY): Payer: Self-pay | Admitting: Obstetrics and Gynecology

## 2016-10-20 ENCOUNTER — Encounter (HOSPITAL_COMMUNITY): Payer: Self-pay

## 2016-10-20 DIAGNOSIS — O09899 Supervision of other high risk pregnancies, unspecified trimester: Secondary | ICD-10-CM

## 2016-10-20 DIAGNOSIS — O09219 Supervision of pregnancy with history of pre-term labor, unspecified trimester: Secondary | ICD-10-CM

## 2016-10-20 DIAGNOSIS — Z3A36 36 weeks gestation of pregnancy: Secondary | ICD-10-CM | POA: Insufficient documentation

## 2016-10-20 DIAGNOSIS — O09213 Supervision of pregnancy with history of pre-term labor, third trimester: Secondary | ICD-10-CM | POA: Diagnosis not present

## 2016-10-20 DIAGNOSIS — O365930 Maternal care for other known or suspected poor fetal growth, third trimester, not applicable or unspecified: Secondary | ICD-10-CM | POA: Diagnosis not present

## 2016-10-20 DIAGNOSIS — O3433 Maternal care for cervical incompetence, third trimester: Secondary | ICD-10-CM | POA: Insufficient documentation

## 2016-10-26 ENCOUNTER — Encounter (HOSPITAL_COMMUNITY): Payer: Self-pay | Admitting: *Deleted

## 2016-10-26 ENCOUNTER — Telehealth (HOSPITAL_COMMUNITY): Payer: Self-pay | Admitting: *Deleted

## 2016-10-26 DIAGNOSIS — Z369 Encounter for antenatal screening, unspecified: Secondary | ICD-10-CM | POA: Diagnosis not present

## 2016-10-26 DIAGNOSIS — O471 False labor at or after 37 completed weeks of gestation: Secondary | ICD-10-CM | POA: Diagnosis not present

## 2016-10-26 DIAGNOSIS — Z3A37 37 weeks gestation of pregnancy: Secondary | ICD-10-CM | POA: Diagnosis not present

## 2016-10-26 NOTE — Telephone Encounter (Signed)
Preadmission screen  

## 2016-10-27 ENCOUNTER — Ambulatory Visit (HOSPITAL_COMMUNITY): Admission: RE | Admit: 2016-10-27 | Payer: 59 | Source: Ambulatory Visit

## 2016-10-29 DIAGNOSIS — Z3A37 37 weeks gestation of pregnancy: Secondary | ICD-10-CM | POA: Diagnosis not present

## 2016-10-29 DIAGNOSIS — O358XX0 Maternal care for other (suspected) fetal abnormality and damage, not applicable or unspecified: Secondary | ICD-10-CM | POA: Diagnosis not present

## 2016-11-01 ENCOUNTER — Inpatient Hospital Stay (HOSPITAL_COMMUNITY): Admission: RE | Admit: 2016-11-01 | Payer: 59 | Source: Ambulatory Visit

## 2016-11-02 ENCOUNTER — Inpatient Hospital Stay (HOSPITAL_COMMUNITY): Admission: RE | Admit: 2016-11-02 | Payer: 59 | Source: Ambulatory Visit

## 2016-11-04 ENCOUNTER — Inpatient Hospital Stay (HOSPITAL_COMMUNITY): Payer: 59 | Admitting: Anesthesiology

## 2016-11-04 ENCOUNTER — Other Ambulatory Visit: Payer: Self-pay | Admitting: Obstetrics and Gynecology

## 2016-11-04 ENCOUNTER — Encounter (HOSPITAL_COMMUNITY): Payer: Self-pay

## 2016-11-04 ENCOUNTER — Inpatient Hospital Stay (HOSPITAL_COMMUNITY)
Admission: AD | Admit: 2016-11-04 | Discharge: 2016-11-07 | DRG: 775 | Disposition: A | Discharge status: Home / Self Care | Payer: 59 | Source: Ambulatory Visit | Attending: Obstetrics & Gynecology | Admitting: Obstetrics & Gynecology

## 2016-11-04 DIAGNOSIS — O365930 Maternal care for other known or suspected poor fetal growth, third trimester, not applicable or unspecified: Secondary | ICD-10-CM | POA: Diagnosis not present

## 2016-11-04 DIAGNOSIS — Z87891 Personal history of nicotine dependence: Secondary | ICD-10-CM

## 2016-11-04 DIAGNOSIS — Z6832 Body mass index (BMI) 32.0-32.9, adult: Secondary | ICD-10-CM | POA: Diagnosis not present

## 2016-11-04 DIAGNOSIS — O3433 Maternal care for cervical incompetence, third trimester: Secondary | ICD-10-CM | POA: Diagnosis present

## 2016-11-04 DIAGNOSIS — O99214 Obesity complicating childbirth: Secondary | ICD-10-CM | POA: Diagnosis present

## 2016-11-04 DIAGNOSIS — Z3A38 38 weeks gestation of pregnancy: Secondary | ICD-10-CM

## 2016-11-04 DIAGNOSIS — E669 Obesity, unspecified: Secondary | ICD-10-CM | POA: Diagnosis present

## 2016-11-04 DIAGNOSIS — A491 Streptococcal infection, unspecified site: Secondary | ICD-10-CM | POA: Diagnosis present

## 2016-11-04 DIAGNOSIS — O99824 Streptococcus B carrier state complicating childbirth: Secondary | ICD-10-CM | POA: Diagnosis present

## 2016-11-04 DIAGNOSIS — Z8249 Family history of ischemic heart disease and other diseases of the circulatory system: Secondary | ICD-10-CM | POA: Diagnosis not present

## 2016-11-04 DIAGNOSIS — Z3493 Encounter for supervision of normal pregnancy, unspecified, third trimester: Secondary | ICD-10-CM | POA: Diagnosis present

## 2016-11-04 DIAGNOSIS — IMO0002 Reserved for concepts with insufficient information to code with codable children: Secondary | ICD-10-CM | POA: Diagnosis present

## 2016-11-04 LAB — CBC
HCT: 35.2 % — ABNORMAL LOW (ref 36.0–46.0)
HEMOGLOBIN: 12.1 g/dL (ref 12.0–15.0)
MCH: 29.4 pg (ref 26.0–34.0)
MCHC: 34.4 g/dL (ref 30.0–36.0)
MCV: 85.4 fL (ref 78.0–100.0)
Platelets: 283 10*3/uL (ref 150–400)
RBC: 4.12 MIL/uL (ref 3.87–5.11)
RDW: 13.7 % (ref 11.5–15.5)
WBC: 10.3 10*3/uL (ref 4.0–10.5)

## 2016-11-04 LAB — TYPE AND SCREEN
ABO/RH(D): A POS
Antibody Screen: NEGATIVE

## 2016-11-04 LAB — POCT FERN TEST: POCT Fern Test: POSITIVE

## 2016-11-04 LAB — RAPID HIV SCREEN (HIV 1/2 AB+AG)
HIV 1/2 Antibodies: NONREACTIVE
HIV-1 P24 Antigen - HIV24: NONREACTIVE
Interpretation (HIV Ag Ab): NONREACTIVE

## 2016-11-04 MED ORDER — LIDOCAINE HCL (PF) 1 % IJ SOLN
INTRAMUSCULAR | Status: DC | PRN
  Administered 2016-11-04 (×2): 4 mL via EPIDURAL

## 2016-11-04 MED ORDER — DIPHENHYDRAMINE HCL 50 MG/ML IJ SOLN: 12.5000 mg | INTRAMUSCULAR | Status: DC | PRN

## 2016-11-04 MED ORDER — LACTATED RINGERS IV SOLN: 500.0000 mL | INTRAVENOUS | Status: DC | PRN

## 2016-11-04 MED ORDER — LIDOCAINE HCL (PF) 1 % IJ SOLN
30.0000 mL | INTRAMUSCULAR | Status: AC | PRN
  Administered 2016-11-05: 30 mL via SUBCUTANEOUS
  Filled 2016-11-04: qty 30

## 2016-11-04 MED ORDER — TERBUTALINE SULFATE 1 MG/ML IJ SOLN
0.2500 mg | Freq: Once | INTRAMUSCULAR | Status: DC | PRN
  Filled 2016-11-04: qty 1

## 2016-11-04 MED ORDER — LACTATED RINGERS IV SOLN
INTRAVENOUS | Status: DC
Start: 2016-11-04 — End: 2016-11-05
  Administered 2016-11-04: 22:00:00 via INTRAVENOUS

## 2016-11-04 MED ORDER — PENICILLIN G POT IN DEXTROSE 60000 UNIT/ML IV SOLN
3.0000 10*6.[IU] | INTRAVENOUS | Status: DC
  Administered 2016-11-04: 3 10*6.[IU] via INTRAVENOUS
  Filled 2016-11-04 (×5): qty 50

## 2016-11-04 MED ORDER — EPHEDRINE 5 MG/ML INJ
10.0000 mg | INTRAVENOUS | Status: DC | PRN
  Filled 2016-11-04: qty 2

## 2016-11-04 MED ORDER — PHENYLEPHRINE 40 MCG/ML (10ML) SYRINGE FOR IV PUSH (FOR BLOOD PRESSURE SUPPORT)
80.0000 ug | PREFILLED_SYRINGE | INTRAVENOUS | Status: DC | PRN
  Administered 2016-11-04: 80 ug via INTRAVENOUS
  Filled 2016-11-04: qty 10
  Filled 2016-11-04: qty 5

## 2016-11-04 MED ORDER — ONDANSETRON HCL 4 MG/2ML IJ SOLN
4.0000 mg | Freq: Four times a day (QID) | INTRAMUSCULAR | Status: DC | PRN
  Administered 2016-11-04: 4 mg via INTRAVENOUS
  Filled 2016-11-04: qty 2

## 2016-11-04 MED ORDER — LACTATED RINGERS IV SOLN: 500.0000 mL | Freq: Once | INTRAVENOUS | Status: DC

## 2016-11-04 MED ORDER — SOD CITRATE-CITRIC ACID 500-334 MG/5ML PO SOLN: 30.0000 mL | ORAL | Status: DC | PRN

## 2016-11-04 MED ORDER — FENTANYL 2.5 MCG/ML BUPIVACAINE 1/10 % EPIDURAL INFUSION (WH - ANES)
14.0000 mL/h | INTRAMUSCULAR | Status: DC | PRN
  Administered 2016-11-04: 14 mL/h via EPIDURAL
  Filled 2016-11-04: qty 100

## 2016-11-04 MED ORDER — OXYTOCIN 40 UNITS IN LACTATED RINGERS INFUSION - SIMPLE MED
2.5000 [IU]/h | INTRAVENOUS | Status: DC
  Filled 2016-11-04: qty 1000

## 2016-11-04 MED ORDER — ACETAMINOPHEN 325 MG PO TABS: 650.0000 mg | ORAL_TABLET | ORAL | Status: DC | PRN

## 2016-11-04 MED ORDER — FLEET ENEMA 7-19 GM/118ML RE ENEM: 1.0000 | ENEMA | RECTAL | Status: DC | PRN

## 2016-11-04 MED ORDER — OXYTOCIN 40 UNITS IN LACTATED RINGERS INFUSION - SIMPLE MED
1.0000 m[IU]/min | INTRAVENOUS | Status: DC
  Administered 2016-11-04: 2 m[IU]/min via INTRAVENOUS

## 2016-11-04 MED ORDER — DEXTROSE 5 % IV SOLN
2.5000 10*6.[IU] | INTRAVENOUS | Status: DC
  Filled 2016-11-04 (×2): qty 2.5

## 2016-11-04 MED ORDER — OXYTOCIN BOLUS FROM INFUSION
500.0000 mL | Freq: Once | INTRAVENOUS | Status: AC
  Administered 2016-11-05: 500 mL via INTRAVENOUS

## 2016-11-04 MED ORDER — PENICILLIN G POTASSIUM 5000000 UNITS IJ SOLR
5.0000 10*6.[IU] | Freq: Once | INTRAMUSCULAR | Status: AC
  Administered 2016-11-04: 5 10*6.[IU] via INTRAVENOUS
  Filled 2016-11-04: qty 5

## 2016-11-04 MED ORDER — PHENYLEPHRINE 40 MCG/ML (10ML) SYRINGE FOR IV PUSH (FOR BLOOD PRESSURE SUPPORT)
80.0000 ug | PREFILLED_SYRINGE | INTRAVENOUS | Status: DC | PRN
  Filled 2016-11-04: qty 5

## 2016-11-04 NOTE — H&P (Signed)
Brittney Anderson is a 31 y.o. female presenting for leakage of fluid since 1330, clear.  She feels mild contractions. She denies vaginal bleeding. With normal gross fetal movement.  Patient is 38 weeks 2 days EGA,  LMP 01/23/2016.  EDC: 11/16/2016 by first trimester US.  Prenatal course significant for:  1. IUGR, last growth US 10/13/16: 3lbs 15 oz (< 10th%), has been getting weekly BPPs and dopplers.  Plan was for delivery on 11/08/16.     2. Patient with history of cervical incompetence, preterm labor and delivery and in current pregnancy had cerclage placed in prophylacticaly, removed at [redacted] weeks EGA. LAst Makena injection at [redacted] weeks EGA.    3.  GBS bacteriuria, patient was treated.   OB History    Gravida Para Term Preterm AB Living   7 2 1 1 4 1    SAB TAB Ectopic Multiple Live Births   1 3     2      Past Medical History:  Diagnosis Date  . Incompetent cervix   . Medical history non-contributory    Past Surgical History:  Procedure Laterality Date  . CERVICAL CERCLAGE  2007  . CERVICAL CERCLAGE N/A 05/13/2016   Procedure: CERCLAGE CERVICAL;  Surgeon: Kirkland HunArthur Stringer, MD;  Location: [facility];  Service: Gynecology;  Laterality: N/A;  . NO PAST SURGERIES     Family History: family history includes Cancer in her maternal aunt; Hypertension in her mother. Social History:  reports that she has quit smoking. Her smoking use included Cigars. She has a 0.50 pack-year smoking history. She has never used smokeless tobacco. She reports that she does not drink alcohol or use drugs.     Maternal Diabetes: No Genetic Screening: Normal Maternal Ultrasounds/Referrals: Abnormal:  Findings:   IUGR Fetal Ultrasounds or other Referrals:  Other:  BPPs, Dopplers.  Maternal Substance Abuse:  No Significant Maternal Medications:  None Significant Maternal Lab Results:  None Other Comments:  None  ROS  Constitutional: Denies fevers/chills Cardiovascular: Denies chest pain or  palpitations Pulmonary: Denies coughing or wheezing Gastrointestinal: Denies nausea, vomiting or diarrhea Genitourinary: Denies pelvic pain, unusual vaginal bleeding, unusual vaginal discharge, dysuria, urgency or frequency.  Musculoskeletal: Denies muscle or joint aches and pain.  Neurology: Denies abnormal sensations such as tingling or numbness.   History Dilation: 3.5 Effacement (%): 70 Station: -2 Exam by:: christy white,rnc Blood pressure 113/64, pulse 76, temperature 98.3 F (36.8 C), temperature source Oral, resp. rate 16, height 5\' 4"  (1.626 m), weight 86.2 kg (190 lb), last menstrual period 02/10/2016, SpO2 100 %. Exam Physical Exam  Constitutional: She is oriented to person, place, and time. She appears well-developed and well-nourished.  HENT:  Head: Normocephalic and atraumatic.  Neck: Normal range of motion.  Cardiovascular: Normal rate, regular rhythm and normal heart sounds.   Respiratory: Effort normal and breath sounds normal.  GI: Soft. Bowel sounds are normal.  Neurological: She is alert and oriented to person, place, and time.  Skin: Skin is warm and dry.  Psychiatric: She has a normal mood and affect. Her behavior is normal.   Prenatal labs: ABO, Rh: A/Positive/-- (10/03 0000) Antibody: Negative (10/03 0000) Rubella: Immune (10/03 0000) RPR: Nonreactive (10/03 0000)  HBsAg: Negative (10/03 0000)  HIV: Non-reactive (10/03 0000)  GBS: Positive (10/13 0000)   Assessment/Plan: 31 y/o [id number] @ 4238 W 2 days with spontaneous rupture of membranes, IUGR  Admit to labor and delivery GBS prophylaxis Reassess in 4 hrs (8.30 pm) and consider pitocin if unchanged cervix.  [facility] 11/04/2016, 5:49 PM

## 2016-11-04 NOTE — Anesthesia Procedure Notes (Signed)
Epidural Patient location during procedure: OB Start time: 11/04/2016 9:48 PM  Staffing Anesthesiologist: Mal AmabileFOSTER, Sayvon Arterberry Performed: anesthesiologist   Preanesthetic Checklist Completed: patient identified, site marked, surgical consent, pre-op evaluation, timeout performed, IV checked, risks and benefits discussed and monitors and equipment checked  Epidural Patient position: sitting Prep: site prepped and draped and DuraPrep Patient monitoring: continuous pulse ox and blood pressure Approach: midline Location: L3-L4 Injection technique: LOR air  Needle:  Needle type: Tuohy  Needle gauge: 17 G Needle length: 9 cm and 9 Needle insertion depth: 6 cm Catheter type: closed end flexible Catheter size: 19 Gauge Catheter at skin depth: 12 cm Test dose: negative and Other  Assessment Events: blood not aspirated, injection not painful, no injection resistance, negative IV test and no paresthesia  Additional Notes Patient identified. Risks and benefits discussed including failed block, incomplete  Pain control, post dural puncture headache, nerve damage, paralysis, blood pressure Changes, nausea, vomiting, reactions to medications-both toxic and allergic and post Partum back pain. All questions were answered. Patient expressed understanding and wished to proceed. Sterile technique was used throughout procedure. Epidural site was Dressed with sterile barrier dressing. No paresthesias, signs of intravascular injection Or signs of intrathecal spread were encountered.  Patient was more comfortable after the epidural was dosed. Please see RN's note for documentation of vital signs and FHR which are stable.

## 2016-11-04 NOTE — MAU Note (Signed)
Pt c/o water leaking that started about 230pm. Pt states contractions started about 3pm. Pt states baby is moving normally. Pt denies bleeding.

## 2016-11-04 NOTE — Progress Notes (Signed)
Herby AbrahamShiquita Paulhus MRN: [id number]  Subjective: -Care assumed of [age] y.o. [id number] at 667w2d who presents for SROM. In room to meet acquaintance of patient and family.  Patient denies contractions and reports overall comfort.  POC of care, for tonight, discussed.  Objective: BP 125/74   Pulse 71   Temp 98 F (36.7 C) (Oral)   Resp 18   Ht 5\' 4"  (1.626 m)   Wt 86.2 kg (190 lb)   LMP 02/10/2016   SpO2 100%   BMI 32.61 kg/m  No intake/output data recorded. No intake/output data recorded.  Fetal Monitoring: FHT: 155 bpm, Mod Var, -Decels, +Accels UC: None graphed or palpated    Physical Exam: General appearance: alert, well appearing, and in no distress. Chest: normal rate and regular rhythm.  clear to auscultation, no wheezes, rales or rhonchi, symmetric air entry. Abdominal exam: Soft, NT, Gravid. Extremities: No Edema Skin exam: Warm Dry  Vaginal Exam: SVE:   Dilation: 3.5 Effacement (%): 70 Station: -2 Exam by:: Gerrit HeckJessica Angelamarie Avakian CNM Membranes:SROM x 6hrs, Forebag ruptured with clear fluid noted Internal Monitors: IUPC inserted  Augmentation/Induction: Pitocin:Initiated Cytotec: None  Assessment:  IUP at 38.2wks Cat I FT  SROM Labor Augmentation GBS Positive  Plan: -Discussed r/b of pitocin including fetal intolerance, tachysystole, as well as stronger contractions and decreased labor time -Discussed IUPC r/b, prior to insertion, including increased risk of infection and ability to adequately monitor quantity and strength of contractions. -Patient agrees to initiation of pitocin and insertion of IUPC -No q/c -Okay for pain medication as desired -Continue other mgmt as ordered  Valma CavaJessica L Kayn Haymore,MSN, CNM 11/04/2016, 8:19 PM

## 2016-11-04 NOTE — Anesthesia Preprocedure Evaluation (Signed)
Anesthesia Evaluation  Patient identified by MRN, date of birth, ID band Patient awake    Reviewed: Allergy & Precautions, Patient's Chart, lab work & pertinent test results  Airway Mallampati: II  TM Distance: >3 FB Neck ROM: Full    Dental no notable dental hx. (+) Teeth Intact   Pulmonary former smoker,    Pulmonary exam normal breath sounds clear to auscultation       Cardiovascular negative cardio ROS Normal cardiovascular exam Rhythm:Regular Rate:Normal     Neuro/Psych negative neurological ROS  negative psych ROS   GI/Hepatic Neg liver ROS, GERD  ,  Endo/Other  Obesity  Renal/GU negative Renal ROS  negative genitourinary   Musculoskeletal negative musculoskeletal ROS (+)   Abdominal (+) + obese,   Peds  Hematology  (+) anemia ,   Anesthesia Other Findings   Reproductive/Obstetrics (+) Pregnancy Incompetent cervix                             Anesthesia Physical Anesthesia Plan  ASA: II  Anesthesia Plan: Epidural   Post-op Pain Management:    Induction:   Airway Management Planned: Natural Airway  Additional Equipment:   Intra-op Plan:   Post-operative Plan:   Informed Consent: I have reviewed the patients History and Physical, chart, labs and discussed the procedure including the risks, benefits and alternatives for the proposed anesthesia with the patient or authorized representative who has indicated his/her understanding and acceptance.   Dental advisory given  Plan Discussed with: Anesthesiologist  Anesthesia Plan Comments:         Anesthesia Quick Evaluation

## 2016-11-05 ENCOUNTER — Encounter (HOSPITAL_COMMUNITY): Payer: Self-pay

## 2016-11-05 DIAGNOSIS — A491 Streptococcal infection, unspecified site: Secondary | ICD-10-CM | POA: Diagnosis present

## 2016-11-05 LAB — RPR: RPR: NONREACTIVE

## 2016-11-05 MED ORDER — SENNOSIDES-DOCUSATE SODIUM 8.6-50 MG PO TABS
2.0000 | ORAL_TABLET | ORAL | Status: DC
  Administered 2016-11-05 – 2016-11-07 (×2): 2 via ORAL
  Filled 2016-11-05 (×2): qty 2

## 2016-11-05 MED ORDER — PRENATAL MULTIVITAMIN CH
1.0000 | ORAL_TABLET | Freq: Every day | ORAL | Status: DC
  Administered 2016-11-05 – 2016-11-06 (×2): 1 via ORAL
  Filled 2016-11-05 (×2): qty 1

## 2016-11-05 MED ORDER — ONDANSETRON HCL 4 MG/2ML IJ SOLN: 4.0000 mg | INTRAMUSCULAR | Status: DC | PRN

## 2016-11-05 MED ORDER — ZOLPIDEM TARTRATE 5 MG PO TABS: 5.0000 mg | ORAL_TABLET | Freq: Every evening | ORAL | Status: DC | PRN

## 2016-11-05 MED ORDER — COCONUT OIL OIL: 1.0000 "application " | TOPICAL_OIL | Status: DC | PRN

## 2016-11-05 MED ORDER — SIMETHICONE 80 MG PO CHEW: 80.0000 mg | CHEWABLE_TABLET | ORAL | Status: DC | PRN

## 2016-11-05 MED ORDER — WITCH HAZEL-GLYCERIN EX PADS: 1.0000 "application " | MEDICATED_PAD | CUTANEOUS | Status: DC | PRN

## 2016-11-05 MED ORDER — BENZOCAINE-MENTHOL 20-0.5 % EX AERO
1.0000 "application " | INHALATION_SPRAY | CUTANEOUS | Status: DC | PRN
  Administered 2016-11-05: 1 via TOPICAL
  Filled 2016-11-05: qty 56

## 2016-11-05 MED ORDER — DIPHENHYDRAMINE HCL 25 MG PO CAPS: 25.0000 mg | ORAL_CAPSULE | Freq: Four times a day (QID) | ORAL | Status: DC | PRN

## 2016-11-05 MED ORDER — ACETAMINOPHEN 325 MG PO TABS
650.0000 mg | ORAL_TABLET | ORAL | Status: DC | PRN
  Administered 2016-11-05: 650 mg via ORAL
  Filled 2016-11-05: qty 2

## 2016-11-05 MED ORDER — ONDANSETRON HCL 4 MG PO TABS: 4.0000 mg | ORAL_TABLET | ORAL | Status: DC | PRN

## 2016-11-05 MED ORDER — IBUPROFEN 600 MG PO TABS
600.0000 mg | ORAL_TABLET | Freq: Four times a day (QID) | ORAL | Status: DC
  Administered 2016-11-05 – 2016-11-07 (×8): 600 mg via ORAL
  Filled 2016-11-05 (×9): qty 1

## 2016-11-05 MED ORDER — TETANUS-DIPHTH-ACELL PERTUSSIS 5-2.5-18.5 LF-MCG/0.5 IM SUSP: 0.5000 mL | Freq: Once | INTRAMUSCULAR | Status: DC

## 2016-11-05 MED ORDER — DIBUCAINE 1 % RE OINT: 1.0000 "application " | TOPICAL_OINTMENT | RECTAL | Status: DC | PRN

## 2016-11-05 NOTE — Lactation Note (Signed)
This note was copied from a baby's chart. Lactation Consultation Note Baby IUGR 4.12 lbs   Baby will not latch. Has no interest in BG. Attempted to stimulate w/gloved finger. Baby wouldn't suckle on finger, bites down hard, tongue thrust. Wouldn't suckle at all. Has no fat pads to cheeks. Jaws tight. Massage cheeks and mandible. Syring feeding unsuccessful. Attempted to spoon feed, took a few swallows. Attempted to bottle feed, took a few swallows after 30 minutes trying to feed, stopped. 1/2 times STS w/mom during attempts of feeding.  Before feeding, hand expressed moms long pedulum breast w/everted nipples. Rt. Breast has areola edema. reverse pressure slightly helpful. W/this edema mom wouldn't be able to latch baby if baby was interested in BF. Encouraged mom to put bra on this morning. Shells given to evert short shaft.  Mom shown how to use DEBP & how to disassemble, clean, & reassemble parts. Mom knows to pump q3h for 15-20 min. Mom will be getting a pump before d/c home. Discussed supply and demand. Mom encouraged to feed baby 8-12 times/24 hours and with feeding cues. Stress importance of waking baby if hasn't cued in 3 hrs.  Discussed care for IUGR, do not exert feedings longer than 30 min. Keep hat on at all times. Do as much STS as possible. Keep strict I&O.  Reported to CN RN of unable to feed baby. [facility] brochure given w/resources, support groups and LC services. Patient Name: Girl Herby AbrahamShiquita Burbage AVWUJ'WToday's Date: 11/05/2016 Reason for consult: Initial assessment;Infant < 6lbs;Difficult latch   Maternal Data Has patient been taught Hand Expression?: Yes Does the patient have breastfeeding experience prior to this delivery?: Yes  Feeding    LATCH Score/Interventions Latch: Too sleepy or reluctant, no latch achieved, no sucking elicited. Intervention(s): Skin to skin;Teach feeding cues;Waking techniques     Type of Nipple: Everted at rest and after stimulation  Comfort  (Breast/Nipple): Soft / non-tender           Lactation Tools Discussed/Used Tools: Pump;Shells Shell Type: Inverted Breast pump type: Double-Electric Breast Pump WIC Program: No Pump Review: Setup, frequency, and cleaning;Milk Storage Initiated by:: Peri JeffersonL. Jaziya Obarr RN IBCLC Date initiated:: 11/05/16   Consult Status Consult Status: Follow-up Date: 11/05/16 Follow-up type: In-patient    Laruen Risser, Diamond NickelLAURA G 11/05/2016, 5:22 AM

## 2016-11-05 NOTE — Lactation Note (Signed)
This note was copied from a baby's chart. Lactation Consultation Note  Patient Name: Brittney Anderson OZHYQ'MToday's Date: 11/05/2016 Reason for consult: Follow-up assessment;Infant < 6lbs Baby having hearing screen and recently had bottle. Mom has not been trying to latch baby today. Mom reports she wants to be sure baby is feeding and she is having to stimulate baby to suckle on bottle with feedings so she has not offered the breast. She has not been pumping either. LC asked Mom if she wanted to continue to offer breast/breast milk. Mom unsure if she wants to work on latch but may still be interested in pumping. Reviewed teaching with Mom. Discussed some different feeding options that include providing breast milk. Reviewed with Mom the importance of stimulation to breast to bring milk in well and protect milk supply. If she wants to do this she needs to start pumping every 3 hours for 15 minutes and encouraged hand expression for 5 minutes after pumping. Advised if she gets her milk to volume we can continue to work on latch if this is her desire. Mom's nipples are flat, not very compressible. Advised Mom that with pumping to soften nipple/aerola and using nipple shield baby may be able to go to breast. Mom will consider her options and advise if she would like assist. Mom is [employer] employee and can get DEBP for d/c home. Mom will continue to supplement with each feeding, at least every 3 hours. Advised to increase supplement amounts to 10 ml increasing to 15 if baby will take this amount. Encouraged to call for assist as needed with breast or bottle feeding.   Maternal Data    Feeding    LATCH Score/Interventions                      Lactation Tools Discussed/Used Tools: Pump;Shells Shell Type: Inverted Breast pump type: Double-Electric Breast Pump   Consult Status Consult Status: Follow-up Date: 11/06/16 Follow-up type: In-patient    Alfred LevinsGranger, Brittney Anderson Ann 11/05/2016, 4:25  PM

## 2016-11-05 NOTE — Progress Notes (Addendum)
Subjective: Postpartum Day 0: Vaginal delivery, right labial laceration Patient up ad lib, reports no syncope or dizziness. Feeding:  Breast, with bottle feeding as supplement due to small baby. Contraceptive plan:  Considering Mirena--declines OCPs and Depo.  Objective: Vital signs in last 24 hours: Temp:  [97.7 F (36.5 C)-99.1 F (37.3 C)] 98.7 F (37.1 C) (05/17 0406) Pulse Rate:  [62-131] 68 (05/17 0406) Resp:  [16-18] 18 (05/17 0406) BP: (94-150)/(42-130) 106/58 (05/17 0406) SpO2:  [99 %-100 %] 100 % (05/16 2240) Weight:  [86.2 kg (190 lb)] 86.2 kg (190 lb) (05/16 1720)  Physical Exam:  General: alert Lochia: appropriate Uterine Fundus: firm Perineum: healing well DVT Evaluation: No evidence of DVT seen on physical exam.   CBC Latest Ref Rng & Units 11/04/2016 05/13/2016 03/26/2016  WBC 4.0 - 10.5 K/uL 10.3 6.1 5.2  Hemoglobin 12.0 - 15.0 g/dL 16.112.1 09.612.8 04.512.5  Hematocrit 36.0 - 46.0 % 35.2(L) 37.4 36.6  Platelets 150 - 400 K/uL 283 284 281     Assessment/Plan: Status post vaginal delivery day 1. Stable Continue current care. Will include Mirena information in d/c instructions. CBC on day 1 Anticipate usual 2 day stay, due to IUGR baby.    Nigel BridgemanLATHAM, Ramel Tobon CNM 11/05/2016, 7:45 AM

## 2016-11-05 NOTE — Anesthesia Postprocedure Evaluation (Signed)
Anesthesia Post Note  Patient: Brittney Anderson  Procedure(s) Performed: * No procedures listed *  Patient location during evaluation: Mother Baby Anesthesia Type: Epidural Level of consciousness: awake and alert and oriented Pain management: pain level controlled Vital Signs Assessment: post-procedure vital signs reviewed and stable Respiratory status: spontaneous breathing and nonlabored ventilation Cardiovascular status: stable Postop Assessment: no headache, no backache, patient able to bend at knees, no signs of nausea or vomiting and adequate PO intake Anesthetic complications: no        Last Vitals:  Vitals:   11/05/16 0406 11/05/16 0820  BP: (!) 106/58 116/80  Pulse: 68 68  Resp: 18 18  Temp: 37.1 C 36.8 C    Last Pain:  Vitals:   11/05/16 0820  TempSrc: Oral  PainSc: 3    Pain Goal: Patients Stated Pain Goal: 2 (11/05/16 0820)               Madison HickmanGREGORY,Veron Senner

## 2016-11-06 LAB — CBC
HEMATOCRIT: 32.9 % — AB (ref 36.0–46.0)
Hemoglobin: 11.3 g/dL — ABNORMAL LOW (ref 12.0–15.0)
MCH: 29.3 pg (ref 26.0–34.0)
MCHC: 34.3 g/dL (ref 30.0–36.0)
MCV: 85.2 fL (ref 78.0–100.0)
PLATELETS: 248 10*3/uL (ref 150–400)
RBC: 3.86 MIL/uL — ABNORMAL LOW (ref 3.87–5.11)
RDW: 14 % (ref 11.5–15.5)
WBC: 11.4 10*3/uL — AB (ref 4.0–10.5)

## 2016-11-06 NOTE — Lactation Note (Signed)
This note was copied from a baby's chart. Lactation Consultation Note  Patient Name: Brittney Herby AbrahamShiquita Eve WUJWJ'XToday's Date: 11/06/2016 Reason for consult: Follow-up assessment [facility] Mom has decided to start pumping to provide breast milk for baby. Per RN, Mom had pumped and received approx 5 ml of colostrum. Advised RN to advise Mom to pump every 3 hours for 15 minutes. LC to follow up with Mom tomorrow. Mom is [employer] employee and will need pump before d/c home if still plans to pump/bottle feed tomorrow.   Maternal Data    Feeding Feeding Type: Bottle Fed - Formula Nipple Type: Slow - flow  LATCH Score/Interventions                      Lactation Tools Discussed/Used     Consult Status Consult Status: Follow-up Date: 11/07/16 Follow-up type: In-patient    Alfred LevinsGranger, Dominigue Gellner Ann 11/06/2016, 5:42 PM

## 2016-11-06 NOTE — Progress Notes (Signed)
Post Partum Day 1 Subjective: no complaints  Objective: Blood pressure 116/63, pulse 63, temperature 98.2 F (36.8 C), temperature source Oral, resp. rate 18, height 5\' 4"  (1.626 m), weight 190 lb (86.2 kg), last menstrual period 02/10/2016, SpO2 100 %, unknown if currently breastfeeding. Pt is breastfeeding- Physical Exam:  General: alert, cooperative and appears stated age Lochia: appropriate Uterine Fundus: firm Incision:  DVT Evaluation: No evidence of DVT seen on physical exam.   Recent Labs  11/04/16 1715 11/06/16 0527  HGB 12.1 11.3*  HCT 35.2* 32.9*    Assessment/Plan: Plan for discharge tomorrow, Breastfeeding and Contraception undecided   Lori A Clemmons 11/06/2016, 9:35 AM

## 2016-11-06 NOTE — Lactation Note (Signed)
This note was copied from a baby's chart. Lactation Consultation Note  Patient Name: Brittney Anderson WGNFA'OToday's Date: 11/06/2016 Reason for consult: Follow-up assessment;Infant < 6lbs Mom is planning to continue formula/bottle feeding. Encouraged to offer bottle every 2-3 hours increasing amounts to support baby's calorie needs (20-30 ml) each feeding.  Mom to advise if she would like any further LC assist or has questions/concerns.   Maternal Data    Feeding    LATCH Score/Interventions                      Lactation Tools Discussed/Used Tools: Pump;Shells Shell Type: Inverted Breast pump type: Double-Electric Breast Pump   Consult Status Consult Status: Complete Date: 11/06/16 Follow-up type: In-patient    Brittney LevinsGranger, Brittney Anderson 11/06/2016, 11:28 AM

## 2016-11-07 MED ORDER — IBUPROFEN 600 MG PO TABS: 600.0000 mg | ORAL_TABLET | Freq: Four times a day (QID) | ORAL | 0 refills | Status: AC

## 2016-11-07 NOTE — Discharge Summary (Signed)
Obstetric Discharge Summary Reason for Admission: rupture of membranes Prenatal Procedures: ultrasound for IUGR Intrapartum Procedures: spontaneous vaginal delivery Postpartum Procedures: none Complications-Operative and Postpartum: 1 degree perineal laceration Hemoglobin  Date Value Ref Range Status  11/06/2016 11.3 (L) 12.0 - 15.0 g/dL Final   HCT  Date Value Ref Range Status  11/06/2016 32.9 (L) 36.0 - 46.0 % Final    Physical Exam:  General: alert, cooperative and appears stated age 1Lochia: appropriate Uterine Fundus: firm Incision:  DVT Evaluation: No evidence of DVT seen on physical exam.  Discharge Diagnoses: Term Pregnancy-delivered  Discharge Information: Date: 11/07/2016 Activity: pelvic rest Diet: routine Medications: PNV and Ibuprofen Condition: stable Instructions: refer to practice specific booklet Discharge to: home Follow-up Information    [facility]. Schedule an appointment as soon as possible for a visit in 6 week(s).   Specialty:  Obstetrics and Gynecology Contact information: [address] [place] [phone]          Newborn Data: Live born female  Birth Weight: 4 lb 12.4 oz (2166 g) APGAR: 9, 9  Home with mother.  Shawna Wearing A Hilliary Jock 11/07/2016, 9:04 AM

## 2016-11-07 NOTE — Lactation Note (Signed)
This note was copied from a baby's chart. Lactation Consultation Note  Patient Name: Brittney Anderson ZOXWR'UToday's Date: 11/07/2016  Mom is pumping and bottle feeding.  She obtained 60 mls this AM.  Instructed to pump 8-12 times in 24 hours.  Reviewed milk storage.  Breasts are comfortable.  Mom is a [employer], [employer] employee and medela pump in style provided.  Outpatient lactation services and support reviewed and encouraged prn.   Maternal Data    Feeding Feeding Type: Bottle Fed - Formula Nipple Type: Slow - flow  LATCH Score/Interventions                      Lactation Tools Discussed/Used     Consult Status      [name], Rodderick Holtzer S 11/07/2016, 9:50 AM

## 2016-11-07 NOTE — Discharge Instructions (Signed)
Vaginal Delivery, Care After Refer to this sheet in the next few weeks. These instructions provide you with information about caring for yourself after vaginal delivery. Your health care provider may also give you more specific instructions. Your treatment has been planned according to current medical practices, but problems sometimes occur. Call your health care provider if you have any problems or questions. What can I expect after the procedure? After vaginal delivery, it is common to have:  Some bleeding from your vagina.  Soreness in your abdomen, your vagina, and the area of skin between your vaginal opening and your anus (perineum).  Pelvic cramps.  Fatigue. Follow these instructions at home: Medicines   Take over-the-counter and prescription medicines only as told by your health care provider.  If you were prescribed an antibiotic medicine, take it as told by your health care provider. Do not stop taking the antibiotic until it is finished. Driving    Do not drive or operate heavy machinery while taking prescription pain medicine.  Do not drive for 24 hours if you received a sedative. Lifestyle   Do not drink alcohol. This is especially important if you are breastfeeding or taking medicine to relieve pain.  Do not use tobacco products, including cigarettes, chewing tobacco, or e-cigarettes. If you need help quitting, ask your health care provider. Eating and drinking   Drink at least 8 eight-ounce glasses of water every day unless you are told not to by your health care provider. If you choose to breastfeed your baby, you may need to drink more water than this.  Eat high-fiber foods every day. These foods may help prevent or relieve constipation. High-fiber foods include:  Whole grain cereals and breads.  Brown rice.  Beans.  Fresh fruits and vegetables. Activity   Return to your normal activities as told by your health care provider. Ask your health care  provider what activities are safe for you.  Rest as much as possible. Try to rest or take a nap when your baby is sleeping.  Do not lift anything that is heavier than your baby or 10 lb (4.5 kg) until your health care provider says that it is safe.  Talk with your health care provider about when you can engage in sexual activity. This may depend on your:  Risk of infection.  Rate of healing.  Comfort and desire to engage in sexual activity. Vaginal Care   If you have an episiotomy or a vaginal tear, check the area every day for signs of infection. Check for:  More redness, swelling, or pain.  More fluid or blood.  Warmth.  Pus or a bad smell.  Do not use tampons or douches until your health care provider says this is safe.  Watch for any blood clots that may pass from your vagina. These may look like clumps of dark red, brown, or black discharge. General instructions   Keep your perineum clean and dry as told by your health care provider.  Wear loose, comfortable clothing.  Wipe from front to back when you use the toilet.  Ask your health care provider if you can shower or take a bath. If you had an episiotomy or a perineal tear during labor and delivery, your health care provider may tell you not to take baths for a certain length of time.  Wear a bra that supports your breasts and fits you well.  If possible, have someone help you with household activities and help care for your baby  for at least a few days after you leave the hospital.  Keep all follow-up visits for you and your baby as told by your health care provider. This is important. Contact a health care provider if:  You have:  Vaginal discharge that has a bad smell.  Difficulty urinating.  Pain when urinating.  A sudden increase or decrease in the frequency of your bowel movements.  More redness, swelling, or pain around your episiotomy or vaginal tear.  More fluid or blood coming from your  episiotomy or vaginal tear.  Pus or a bad smell coming from your episiotomy or vaginal tear.  A fever.  A rash.  Little or no interest in activities you used to enjoy.  Questions about caring for yourself or your baby.  Your episiotomy or vaginal tear feels warm to the touch.  Your episiotomy or vaginal tear is separating or does not appear to be healing.  Your breasts are painful, hard, or turn red.  You feel unusually sad or worried.  You feel nauseous or you vomit.  You pass large blood clots from your vagina. If you pass a blood clot from your vagina, save it to show to your health care provider. Do not flush blood clots down the toilet without having your health care provider look at them.  You urinate more than usual.  You are dizzy or light-headed.  You have not breastfed at all and you have not had a menstrual period for 12 weeks after delivery.  You have stopped breastfeeding and you have not had a menstrual period for 12 weeks after you stopped breastfeeding. Get help right away if:  You have:  Pain that does not go away or does not get better with medicine.  Chest pain.  Difficulty breathing.  Blurred vision or spots in your vision.  Thoughts about hurting yourself or your baby.  You develop pain in your abdomen or in one of your legs.  You develop a severe headache.  You faint.  You bleed from your vagina so much that you fill two sanitary pads in one hour. This information is not intended to replace advice given to you by your health care provider. Make sure you discuss any questions you have with your health care provider. Document Released: 06/05/2000 Document Revised: 11/20/2015 Document Reviewed: 06/23/2015 Elsevier Interactive Patient Education  2017 [employer]. Levonorgestrel intrauterine device (IUD) What is this medicine? LEVONORGESTREL IUD (LEE voe nor jes trel) is a contraceptive (birth control) device. The device is placed inside the  uterus by a healthcare professional. It is used to prevent pregnancy. This device can also be used to treat heavy bleeding that occurs during your period. This medicine may be used for other purposes; ask your health care provider or pharmacist if you have questions. COMMON BRAND NAME(S): Cameron Ali What should I tell my health care provider before I take this medicine? They need to know if you have any of these conditions: -abnormal Pap smear -cancer of the breast, uterus, or cervix -diabetes -endometritis -genital or pelvic infection now or in the past -have more than one sexual partner or your partner has more than one partner -heart disease -history of an ectopic or tubal pregnancy -immune system problems -IUD in place -liver disease or tumor -problems with blood clots or take blood-thinners -seizures -use intravenous drugs -uterus of unusual shape -vaginal bleeding that has not been explained -an unusual or allergic reaction to levonorgestrel, other hormones, silicone, or polyethylene, medicines, foods,  dyes, or preservatives -pregnant or trying to get pregnant -breast-feeding How should I use this medicine? This device is placed inside the uterus by a health care professional. Talk to your pediatrician regarding the use of this medicine in children. Special care may be needed. Overdosage: If you think you have taken too much of this medicine contact a poison control center or emergency room at once. NOTE: This medicine is only for you. Do not share this medicine with others. What if I miss a dose? This does not apply. Depending on the brand of device you have inserted, the device will need to be replaced every 3 to 5 years if you wish to continue using this type of birth control. What may interact with this medicine? Do not take this medicine with any of the following medications: -amprenavir -bosentan -fosamprenavir This medicine may also interact with  the following medications: -aprepitant -armodafinil -barbiturate medicines for inducing sleep or treating seizures -bexarotene -boceprevir -griseofulvin -medicines to treat seizures like carbamazepine, ethotoin, felbamate, oxcarbazepine, phenytoin, topiramate -modafinil -pioglitazone -rifabutin -rifampin -rifapentine -some medicines to treat HIV infection like atazanavir, efavirenz, indinavir, lopinavir, nelfinavir, tipranavir, ritonavir -St. John's wort -warfarin This list may not describe all possible interactions. Give your health care provider a list of all the medicines, herbs, non-prescription drugs, or dietary supplements you use. Also tell them if you smoke, drink alcohol, or use illegal drugs. Some items may interact with your medicine. What should I watch for while using this medicine? Visit your doctor or health care professional for regular check ups. See your doctor if you or your partner has sexual contact with others, becomes HIV positive, or gets a sexual transmitted disease. This product does not protect you against HIV infection (AIDS) or other sexually transmitted diseases. You can check the placement of the IUD yourself by reaching up to the top of your vagina with clean fingers to feel the threads. Do not pull on the threads. It is a good habit to check placement after each menstrual period. Call your doctor right away if you feel more of the IUD than just the threads or if you cannot feel the threads at all. The IUD may come out by itself. You may become pregnant if the device comes out. If you notice that the IUD has come out use a backup birth control method like condoms and call your health care provider. Using tampons will not change the position of the IUD and are okay to use during your period. This IUD can be safely scanned with magnetic resonance imaging (MRI) only under specific conditions. Before you have an MRI, tell your healthcare provider that you have an  IUD in place, and which type of IUD you have in place. What side effects may I notice from receiving this medicine? Side effects that you should report to your doctor or health care professional as soon as possible: -allergic reactions like skin rash, itching or hives, swelling of the face, lips, or tongue -fever, flu-like symptoms -genital sores -high blood pressure -no menstrual period for 6 weeks during use -pain, swelling, warmth in the leg -pelvic pain or tenderness -severe or sudden headache -signs of pregnancy -stomach cramping -sudden shortness of breath -trouble with balance, talking, or walking -unusual vaginal bleeding, discharge -yellowing of the eyes or skin Side effects that usually do not require medical attention (report to your doctor or health care professional if they continue or are bothersome): -acne -breast pain -change in sex drive or performance -changes in  weight -cramping, dizziness, or faintness while the device is being inserted -headache -irregular menstrual bleeding within first 3 to 6 months of use -nausea This list may not describe all possible side effects. Call your doctor for medical advice about side effects. You may report side effects to FDA at 1-800-FDA-1088. Where should I keep my medicine? This does not apply. NOTE: This sheet is a summary. It may not cover all possible information. If you have questions about this medicine, talk to your doctor, pharmacist, or health care provider.  2018 Elsevier/Gold Standard (2016-03-20 14:14:56)

## 2016-11-08 ENCOUNTER — Inpatient Hospital Stay (HOSPITAL_COMMUNITY): Payer: 59

## 2016-12-14 DIAGNOSIS — N939 Abnormal uterine and vaginal bleeding, unspecified: Secondary | ICD-10-CM | POA: Diagnosis not present

## 2017-01-01 MED FILL — METHYLERGONOVINE MALEATE 0.: 0.2 | 1 days supply | Qty: 4 | Fill #0

## 2017-10-05 DIAGNOSIS — Z7689 Persons encountering health services in other specified circumstances: Secondary | ICD-10-CM | POA: Diagnosis not present

## 2017-10-05 DIAGNOSIS — Z6834 Body mass index (BMI) 34.0-34.9, adult: Secondary | ICD-10-CM | POA: Diagnosis not present

## 2017-10-05 DIAGNOSIS — Z708 Other sex counseling: Secondary | ICD-10-CM | POA: Diagnosis not present

## 2017-10-05 DIAGNOSIS — Z7189 Other specified counseling: Secondary | ICD-10-CM | POA: Diagnosis not present

## 2017-10-05 DIAGNOSIS — Z0000 Encounter for general adult medical examination without abnormal findings: Secondary | ICD-10-CM | POA: Diagnosis not present

## 2017-10-20 DIAGNOSIS — H52223 Regular astigmatism, bilateral: Secondary | ICD-10-CM | POA: Diagnosis not present

## 2018-06-01 DIAGNOSIS — N898 Other specified noninflammatory disorders of vagina: Secondary | ICD-10-CM | POA: Diagnosis not present

## 2019-02-03 DIAGNOSIS — L0291 Cutaneous abscess, unspecified: Secondary | ICD-10-CM | POA: Diagnosis not present

## 2019-02-06 DIAGNOSIS — L0291 Cutaneous abscess, unspecified: Secondary | ICD-10-CM | POA: Diagnosis not present

## 2019-02-06 DIAGNOSIS — B379 Candidiasis, unspecified: Secondary | ICD-10-CM | POA: Diagnosis not present

## 2019-02-06 DIAGNOSIS — Z13228 Encounter for screening for other metabolic disorders: Secondary | ICD-10-CM | POA: Diagnosis not present

## 2019-02-06 DIAGNOSIS — Z13220 Encounter for screening for lipoid disorders: Secondary | ICD-10-CM | POA: Diagnosis not present

## 2019-02-06 DIAGNOSIS — Z130 Encounter for screening for diseases of the blood and blood-forming organs and certain disorders involving the immune mechanism: Secondary | ICD-10-CM | POA: Diagnosis not present

## 2019-02-06 DIAGNOSIS — Z1329 Encounter for screening for other suspected endocrine disorder: Secondary | ICD-10-CM | POA: Diagnosis not present

## 2019-02-06 DIAGNOSIS — Z0000 Encounter for general adult medical examination without abnormal findings: Secondary | ICD-10-CM | POA: Diagnosis not present

## 2019-05-23 DIAGNOSIS — Z3009 Encounter for other general counseling and advice on contraception: Secondary | ICD-10-CM | POA: Diagnosis not present

## 2019-05-24 DIAGNOSIS — Z3202 Encounter for pregnancy test, result negative: Secondary | ICD-10-CM | POA: Diagnosis not present

## 2019-08-07 DIAGNOSIS — Z3009 Encounter for other general counseling and advice on contraception: Secondary | ICD-10-CM | POA: Diagnosis not present

## 2020-01-16 DIAGNOSIS — Z20822 Contact with and (suspected) exposure to covid-19: Secondary | ICD-10-CM | POA: Diagnosis not present

## 2020-02-13 DIAGNOSIS — Z0000 Encounter for general adult medical examination without abnormal findings: Secondary | ICD-10-CM | POA: Diagnosis not present

## 2020-02-13 DIAGNOSIS — Z113 Encounter for screening for infections with a predominantly sexual mode of transmission: Secondary | ICD-10-CM | POA: Diagnosis not present

## 2020-02-13 DIAGNOSIS — Z6830 Body mass index (BMI) 30.0-30.9, adult: Secondary | ICD-10-CM | POA: Diagnosis not present

## 2020-02-13 DIAGNOSIS — N841 Polyp of cervix uteri: Secondary | ICD-10-CM | POA: Diagnosis not present

## 2020-02-13 DIAGNOSIS — N760 Acute vaginitis: Secondary | ICD-10-CM | POA: Diagnosis not present

## 2020-02-13 DIAGNOSIS — Z01411 Encounter for gynecological examination (general) (routine) with abnormal findings: Secondary | ICD-10-CM | POA: Diagnosis not present

## 2020-02-13 DIAGNOSIS — Z708 Other sex counseling: Secondary | ICD-10-CM | POA: Diagnosis not present

## 2020-03-19 ENCOUNTER — Ambulatory Visit: Payer: 59 | Attending: Internal Medicine

## 2020-03-19 DIAGNOSIS — Z23 Encounter for immunization: Secondary | ICD-10-CM

## 2020-03-19 NOTE — Progress Notes (Signed)
   Covid-19 Vaccination Clinic  Name:  Hope Brandenburger    MRN: [id number] DOB: 11-Oct-1985  03/19/2020  Ms. Pires was observed post Covid-19 immunization for 15 minutes without incident. She was provided with Vaccine Information Sheet and instruction to access the V-Safe system.  Vaccinated by [facility]  Ms. Lac was instructed to call 911 with any severe reactions post vaccine: Marland Kitchen Difficulty breathing  . Swelling of face and throat  . A fast heartbeat  . A bad rash all over body  . Dizziness and weakness   Immunizations Administered    Name Date Dose VIS Date Route   JANSSEN COVID-19 VACCINE 03/19/2020  1:49 PM 0.5 mL 08/19/2019 Intramuscular   Manufacturer: [employer]   Lot: [id number]   NDC: 58850-277-41

## 2020-09-09 DIAGNOSIS — Z124 Encounter for screening for malignant neoplasm of cervix: Secondary | ICD-10-CM | POA: Diagnosis not present

## 2020-09-09 DIAGNOSIS — Z01419 Encounter for gynecological examination (general) (routine) without abnormal findings: Secondary | ICD-10-CM | POA: Diagnosis not present

## 2020-12-31 DIAGNOSIS — Z309 Encounter for contraceptive management, unspecified: Secondary | ICD-10-CM | POA: Diagnosis not present

## 2021-01-06 DIAGNOSIS — Z3042 Encounter for surveillance of injectable contraceptive: Secondary | ICD-10-CM | POA: Diagnosis not present

## 2021-04-07 DIAGNOSIS — Z3042 Encounter for surveillance of injectable contraceptive: Secondary | ICD-10-CM | POA: Diagnosis not present

## 2021-04-07 DIAGNOSIS — Z23 Encounter for immunization: Secondary | ICD-10-CM | POA: Diagnosis not present

## 2021-07-02 DIAGNOSIS — Z3042 Encounter for surveillance of injectable contraceptive: Secondary | ICD-10-CM | POA: Diagnosis not present

## 2021-09-29 DIAGNOSIS — Z13220 Encounter for screening for lipoid disorders: Secondary | ICD-10-CM | POA: Diagnosis not present

## 2021-09-29 DIAGNOSIS — Z0001 Encounter for general adult medical examination with abnormal findings: Secondary | ICD-10-CM | POA: Diagnosis not present

## 2021-09-29 DIAGNOSIS — Z1329 Encounter for screening for other suspected endocrine disorder: Secondary | ICD-10-CM | POA: Diagnosis not present

## 2021-09-29 DIAGNOSIS — Z6832 Body mass index (BMI) 32.0-32.9, adult: Secondary | ICD-10-CM | POA: Diagnosis not present

## 2021-09-29 DIAGNOSIS — E6609 Other obesity due to excess calories: Secondary | ICD-10-CM | POA: Diagnosis not present

## 2021-09-29 DIAGNOSIS — Z0000 Encounter for general adult medical examination without abnormal findings: Secondary | ICD-10-CM | POA: Diagnosis not present

## 2021-09-29 DIAGNOSIS — Z113 Encounter for screening for infections with a predominantly sexual mode of transmission: Secondary | ICD-10-CM | POA: Diagnosis not present

## 2021-09-29 DIAGNOSIS — Z3042 Encounter for surveillance of injectable contraceptive: Secondary | ICD-10-CM | POA: Diagnosis not present

## 2021-12-15 DIAGNOSIS — Z3042 Encounter for surveillance of injectable contraceptive: Secondary | ICD-10-CM | POA: Diagnosis not present

## 2022-03-04 DIAGNOSIS — Z23 Encounter for immunization: Secondary | ICD-10-CM | POA: Diagnosis not present

## 2022-03-27 DIAGNOSIS — N898 Other specified noninflammatory disorders of vagina: Secondary | ICD-10-CM | POA: Diagnosis not present

## 2022-03-27 DIAGNOSIS — R8290 Unspecified abnormal findings in urine: Secondary | ICD-10-CM | POA: Diagnosis not present

## 2023-01-19 DIAGNOSIS — Z708 Other sex counseling: Secondary | ICD-10-CM | POA: Diagnosis not present

## 2023-01-19 DIAGNOSIS — Z0000 Encounter for general adult medical examination without abnormal findings: Secondary | ICD-10-CM | POA: Diagnosis not present

## 2023-01-19 DIAGNOSIS — N898 Other specified noninflammatory disorders of vagina: Secondary | ICD-10-CM | POA: Diagnosis not present

## 2023-01-19 DIAGNOSIS — Z1159 Encounter for screening for other viral diseases: Secondary | ICD-10-CM | POA: Diagnosis not present

## 2023-01-19 DIAGNOSIS — Z0001 Encounter for general adult medical examination with abnormal findings: Secondary | ICD-10-CM | POA: Diagnosis not present

## 2023-01-19 DIAGNOSIS — Z7182 Exercise counseling: Secondary | ICD-10-CM | POA: Diagnosis not present

## 2023-01-28 ENCOUNTER — Telehealth: Payer: 59 | Admitting: Physician Assistant

## 2023-01-28 DIAGNOSIS — U071 COVID-19: Secondary | ICD-10-CM | POA: Diagnosis not present

## 2023-01-28 MED ORDER — PROMETHAZINE-DM 6.25-15 MG/5ML PO SYRP
5.0000 mL | ORAL_SOLUTION | Freq: Four times a day (QID) | ORAL | 0 refills | Status: AC | PRN
Start: 2023-01-28 — End: ?

## 2023-01-28 MED ORDER — FLUTICASONE PROPIONATE 50 MCG/ACT NA SUSP
2.0000 | Freq: Every day | NASAL | 0 refills | Status: AC
Start: 2023-01-28 — End: ?

## 2023-01-28 NOTE — Progress Notes (Signed)
Virtual Visit Consent   Brittney Anderson, you are scheduled for a virtual visit with a [facility] provider today. Just as with appointments in the office, your consent must be obtained to participate. Your consent will be active for this visit and any virtual visit you may have with one of our providers in the next 365 days. If you have a MyChart account, a copy of this consent can be sent to you electronically.  As this is a virtual visit, video technology does not allow for your provider to perform a traditional examination. This may limit your provider's ability to fully assess your condition. If your provider identifies any concerns that need to be evaluated in person or the need to arrange testing (such as labs, EKG, etc.), we will make arrangements to do so. Although advances in technology are sophisticated, we cannot ensure that it will always work on either your end or our end. If the connection with a video visit is poor, the visit may have to be switched to a telephone visit. With either a video or telephone visit, we are not always able to ensure that we have a secure connection.  By engaging in this virtual visit, you consent to the provision of healthcare and authorize for your insurance to be billed (if applicable) for the services provided during this visit. Depending on your insurance coverage, you may receive a charge related to this service.  I need to obtain your verbal consent now. Are you willing to proceed with your visit today? [name] has provided verbal consent on 01/28/2023 for a virtual visit (video or telephone). Margaretann Loveless, PA-C  Date: 01/28/2023 10:04 AM  Virtual Visit via Video Note   I, Margaretann Loveless, connected with  Brittney Anderson  ([id number], March 18, 1986) on 01/28/23 at  9:45 AM EDT by a video-enabled telemedicine application and verified that I am speaking with the correct person using two identifiers.  Location: Patient: Virtual Visit  Location Patient: Home Provider: Virtual Visit Location Provider: Home Office   I discussed the limitations of evaluation and management by telemedicine and the availability of in person appointments. The patient expressed understanding and agreed to proceed.    History of Present Illness: Brittney Anderson is a 37 y.o. who identifies as a female who was assigned female at birth, and is being seen today for Covid 73.  HPI: URI  This is a new problem. The current episode started in the past 7 days (Tested positive for Covid 19 on Tuesday, 01/26/23; Symptoms started Sunday, 01/24/23). The problem has been gradually worsening. There has been no fever. Associated symptoms include congestion, coughing, headaches, a plugged ear sensation, rhinorrhea, sinus pain and a sore throat (scratchy). Pertinent negatives include no diarrhea, ear pain, nausea or vomiting. Associated symptoms comments: Decreased apppetite, fatigue, body aches, chills. She has tried increased fluids (dayquil severe cold and flu, emergen-C) for the symptoms. The treatment provided no relief.  No history of Covid 19.    Problems: There are no problems to display for this patient.   Allergies: No Known Allergies Medications:  Current Outpatient Medications:    fluticasone (FLONASE) 50 MCG/ACT nasal spray, Place 2 sprays into both nostrils daily., Disp: 16 g, Rfl: 0   promethazine-dextromethorphan (PROMETHAZINE-DM) 6.25-15 MG/5ML syrup, Take 5 mLs by mouth 4 (four) times daily as needed., Disp: 118 mL, Rfl: 0   ibuprofen (ADVIL,MOTRIN) 600 MG tablet, Take 1 tablet (600 mg total) by mouth every 6 (six) hours., Disp: 30 tablet, Rfl:  0   Prenatal Vit-Fe Fumarate-FA (PRENATAL VITAMIN PO), Take 1 tablet by mouth every morning. , Disp: , Rfl:   Observations/Objective: Patient is well-developed, well-nourished in no acute distress.  Resting comfortably at home.  Head is normocephalic, atraumatic.  No labored breathing.  Speech is clear  and coherent with logical content.  Patient is alert and oriented at baseline.    Assessment and Plan: 1. COVID-19 - MyChart COVID-19 home monitoring program; Future - fluticasone (FLONASE) 50 MCG/ACT nasal spray; Place 2 sprays into both nostrils daily.  Dispense: 16 g; Refill: 0 - promethazine-dextromethorphan (PROMETHAZINE-DM) 6.25-15 MG/5ML syrup; Take 5 mLs by mouth 4 (four) times daily as needed.  Dispense: 118 mL; Refill: 0  - Continue OTC symptomatic management of choice - Will send OTC vitamins and supplement information through AVS - Promethazine DM and Fluticasone prescribed - Patient enrolled in MyChart symptom monitoring - Push fluids - Rest as needed - Discussed return precautions and when to seek in-person evaluation, sent via AVS as well   Follow Up Instructions: I discussed the assessment and treatment plan with the patient. The patient was provided an opportunity to ask questions and all were answered. The patient agreed with the plan and demonstrated an understanding of the instructions.  A copy of instructions were sent to the patient via MyChart unless otherwise noted below.    The patient was advised to call back or seek an in-person evaluation if the symptoms worsen or if the condition fails to improve as anticipated.  Time:  I spent 20 minutes with the patient via telehealth technology discussing the above problems/concerns.    Margaretann Loveless, PA-C

## 2023-01-28 NOTE — Patient Instructions (Signed)
Brittney Anderson, thank you for joining Margaretann Loveless, PA-C for today's virtual visit.  While this provider is not your primary care provider (PCP), if your PCP is located in our provider database this encounter information will be shared with them immediately following your visit.   A [facility] MyChart account gives you access to today's visit and all your visits, tests, and labs performed at [facility] " click here if you don't have a [facility] MyChart account or go to [email or web address]  Consent: (Patient) Brittney Anderson provided verbal consent for this virtual visit at the beginning of the encounter.  Current Medications:  Current Outpatient Medications:    fluticasone (FLONASE) 50 MCG/ACT nasal spray, Place 2 sprays into both nostrils daily., Disp: 16 g, Rfl: 0   promethazine-dextromethorphan (PROMETHAZINE-DM) 6.25-15 MG/5ML syrup, Take 5 mLs by mouth 4 (four) times daily as needed., Disp: 118 mL, Rfl: 0   ibuprofen (ADVIL,MOTRIN) 600 MG tablet, Take 1 tablet (600 mg total) by mouth every 6 (six) hours., Disp: 30 tablet, Rfl: 0   Prenatal Vit-Fe Fumarate-FA (PRENATAL VITAMIN PO), Take 1 tablet by mouth every morning. , Disp: , Rfl:    Medications ordered in this encounter:  Meds ordered this encounter  Medications   fluticasone (FLONASE) 50 MCG/ACT nasal spray    Sig: Place 2 sprays into both nostrils daily.    Dispense:  16 g    Refill:  0    Order Specific Question:   Supervising Provider    Answer:   Merrilee Jansky [id number]   promethazine-dextromethorphan (PROMETHAZINE-DM) 6.25-15 MG/5ML syrup    Sig: Take 5 mLs by mouth 4 (four) times daily as needed.    Dispense:  118 mL    Refill:  0    Order Specific Question:   Supervising Provider    Answer:   Merrilee Jansky [[id number]]     *If you need refills on other medications prior to your next appointment, please contact your pharmacy*  Follow-Up: Call back or seek an in-person evaluation  if the symptoms worsen or if the condition fails to improve as anticipated.  [facility] [phone]  Care Instructions: Can take to lessen severity: Vit C 500mg  twice daily Quercertin 250-500mg  twice daily Zinc 75-100mg  daily Melatonin 3-6 mg at bedtime Vit D3 1000-2000 IU daily Aspirin 81 mg daily with food Optional: Famotidine 20mg  daily Also can add tylenol/ibuprofen as needed for fevers and body aches May add Mucinex or Mucinex DM as needed for cough/congestion    Isolation Instructions: You are to isolate at home until you have been fever free for at least 24 hours without a fever-reducing medication, and symptoms have been steadily improving for 24 hours. At that time,  you can end isolation but need to mask for an additional 5 days.   If you must be around other household members who do not have symptoms, you need to make sure that both you and the family members are masking consistently with a high-quality mask.  If you note any worsening of symptoms despite treatment, please seek an in-person evaluation ASAP. If you note any significant shortness of breath or any chest pain, please seek ER evaluation. Please do not delay care!   COVID-19: What to Do if You Are Sick If you test positive and are an older adult or someone who is at high risk of getting very sick from COVID-19, treatment may be available. Contact a healthcare provider right away after a positive test  to determine if you are eligible, even if your symptoms are mild right now. You can also visit a Test to Treat location and, if eligible, receive a prescription from a provider. Don't delay: Treatment must be started within the first few days to be effective. If you have a fever, cough, or other symptoms, you might have COVID-19. Most people have mild illness and are able to recover at home. If you are sick: Keep track of your symptoms. If you have an emergency warning sign (including trouble  breathing), call 911. Steps to help prevent the spread of COVID-19 if you are sick If you are sick with COVID-19 or think you might have COVID-19, follow the steps below to care for yourself and to help protect other people in your home and community. Stay home except to get medical care Stay home. Most people with COVID-19 have mild illness and can recover at home without medical care. Do not leave your home, except to get medical care. Do not visit public areas and do not go to places where you are unable to wear a mask. Take care of yourself. Get rest and stay hydrated. Take over-the-counter medicines, such as acetaminophen, to help you feel better. Stay in touch with your doctor. Call before you get medical care. Be sure to get care if you have trouble breathing, or have any other emergency warning signs, or if you think it is an emergency. Avoid public transportation, ride-sharing, or taxis if possible. Get tested If you have symptoms of COVID-19, get tested. While waiting for test results, stay away from others, including staying apart from those living in your household. Get tested as soon as possible after your symptoms start. Treatments may be available for people with COVID-19 who are at risk for becoming very sick. Don't delay: Treatment must be started early to be effective--some treatments must begin within 5 days of your first symptoms. Contact your healthcare provider right away if your test result is positive to determine if you are eligible. Self-tests are one of several options for testing for the virus that causes COVID-19 and may be more convenient than laboratory-based tests and point-of-care tests. Ask your healthcare provider or your local health department if you need help interpreting your test results. You can visit your state, tribal, local, and territorial health department's website to look for the latest local information on testing sites. Separate yourself from other  people As much as possible, stay in a specific room and away from other people and pets in your home. If possible, you should use a separate bathroom. If you need to be around other people or animals in or outside of the home, wear a well-fitting mask. Tell your close contacts that they may have been exposed to COVID-19. An infected person can spread COVID-19 starting 48 hours (or 2 days) before the person has any symptoms or tests positive. By letting your close contacts know they may have been exposed to COVID-19, you are helping to protect everyone. See COVID-19 and Animals if you have questions about pets. If you are diagnosed with COVID-19, someone from the health department may call you. Answer the call to slow the spread. Monitor your symptoms Symptoms of COVID-19 include fever, cough, or other symptoms. Follow care instructions from your healthcare provider and local health department. Your local health authorities may give instructions on checking your symptoms and reporting information. When to seek emergency medical attention Look for emergency warning signs* for COVID-19. If someone is showing  any of these signs, seek emergency medical care immediately: Trouble breathing Persistent pain or pressure in the chest New confusion Inability to wake or stay awake Pale, gray, or blue-colored skin, lips, or nail beds, depending on skin tone *This list is not all possible symptoms. Please call your medical provider for any other symptoms that are severe or concerning to you. Call 911 or call ahead to your local emergency facility: Notify the operator that you are seeking care for someone who has or may have COVID-19. Call ahead before visiting your doctor Call ahead. Many medical visits for routine care are being postponed or done by phone or telemedicine. If you have a medical appointment that cannot be postponed, call your doctor's office, and tell them you have or may have COVID-19. This will  help the office protect themselves and other patients. If you are sick, wear a well-fitting mask You should wear a mask if you must be around other people or animals, including pets (even at home). Wear a mask with the best fit, protection, and comfort for you. You don't need to wear the mask if you are alone. If you can't put on a mask (because of trouble breathing, for example), cover your coughs and sneezes in some other way. Try to stay at least 6 feet away from other people. This will help protect the people around you. Masks should not be placed on young children under age 37 years, anyone who has trouble breathing, or anyone who is not able to remove the mask without help. Cover your coughs and sneezes Cover your mouth and nose with a tissue when you cough or sneeze. Throw away used tissues in a lined trash can. Immediately wash your hands with soap and water for at least 20 seconds. If soap and water are not available, clean your hands with an alcohol-based hand sanitizer that contains at least 60% alcohol. Clean your hands often Wash your hands often with soap and water for at least 20 seconds. This is especially important after blowing your nose, coughing, or sneezing; going to the bathroom; and before eating or preparing food. Use hand sanitizer if soap and water are not available. Use an alcohol-based hand sanitizer with at least 60% alcohol, covering all surfaces of your hands and rubbing them together until they feel dry. Soap and water are the best option, especially if hands are visibly dirty. Avoid touching your eyes, nose, and mouth with unwashed hands. Handwashing Tips Avoid sharing personal household items Do not share dishes, drinking glasses, cups, eating utensils, towels, or bedding with other people in your home. Wash these items thoroughly after using them with soap and water or put in the dishwasher. Clean surfaces in your home regularly Clean and disinfect high-touch  surfaces (for example, doorknobs, tables, handles, light switches, and countertops) in your "sick room" and bathroom. In shared spaces, you should clean and disinfect surfaces and items after each use by the person who is ill. If you are sick and cannot clean, a caregiver or other person should only clean and disinfect the area around you (such as your bedroom and bathroom) on an as needed basis. Your caregiver/other person should wait as long as possible (at least several hours) and wear a mask before entering, cleaning, and disinfecting shared spaces that you use. Clean and disinfect areas that may have blood, stool, or body fluids on them. Use household cleaners and disinfectants. Clean visible dirty surfaces with household cleaners containing soap or detergent. Then, use  a household disinfectant. Use a product from [employer] List N: Disinfectants for Coronavirus (COVID-19). Be sure to follow the instructions on the label to ensure safe and effective use of the product. Many products recommend keeping the surface wet with a disinfectant for a certain period of time (look at "contact time" on the product label). You may also need to wear personal protective equipment, such as gloves, depending on the directions on the product label. Immediately after disinfecting, wash your hands with soap and water for 20 seconds. For completed guidance on cleaning and disinfecting your home, visit Complete Disinfection Guidance. Take steps to improve ventilation at home Improve ventilation (air flow) at home to help prevent from spreading COVID-19 to other people in your household. Clear out COVID-19 virus particles in the air by opening windows, using air filters, and turning on fans in your home. Use this interactive tool to learn how to improve air flow in your home. When you can be around others after being sick with COVID-19 Deciding when you can be around others is different for different situations. Find out  when you can safely end home isolation. For any additional questions about your care, contact your healthcare provider or state or local health department. 09/10/2020 Content source: [facility] ([facility]), Division of Viral Diseases This information is not intended to replace advice given to you by your health care provider. Make sure you discuss any questions you have with your health care provider. Document Revised: 10/24/2020 Document Reviewed: 10/24/2020 Elsevier Patient Education  2022 [employer].     If you have been instructed to have an in-person evaluation today at a local Urgent Care facility, please use the link below. It will take you to a list of all of our available [facility], including address, phone number and hours of operation. Please do not delay care.  [facility]  If you or a family member do not have a primary care provider, use the link below to schedule a visit and establish care. When you choose a [facility] primary care physician or advanced practice provider, you gain a long-term partner in health. Find a Primary Care Provider  Learn more about [facility] in-office and virtual care options: [facility] Get Care Now

## 2023-03-05 DIAGNOSIS — R0789 Other chest pain: Secondary | ICD-10-CM | POA: Diagnosis not present

## 2023-03-12 DIAGNOSIS — R0789 Other chest pain: Secondary | ICD-10-CM | POA: Diagnosis not present

## 2023-07-28 DIAGNOSIS — Z01419 Encounter for gynecological examination (general) (routine) without abnormal findings: Secondary | ICD-10-CM | POA: Diagnosis not present

## 2023-07-28 DIAGNOSIS — Z1329 Encounter for screening for other suspected endocrine disorder: Secondary | ICD-10-CM | POA: Diagnosis not present

## 2023-07-28 DIAGNOSIS — Z124 Encounter for screening for malignant neoplasm of cervix: Secondary | ICD-10-CM | POA: Diagnosis not present

## 2023-07-28 DIAGNOSIS — Z113 Encounter for screening for infections with a predominantly sexual mode of transmission: Secondary | ICD-10-CM | POA: Diagnosis not present

## 2023-10-20 ENCOUNTER — Encounter: Payer: Self-pay | Admitting: Dietician

## 2023-10-20 ENCOUNTER — Encounter: Attending: Family Medicine | Admitting: Dietician

## 2023-10-20 VITALS — Wt 202.3 lb

## 2023-10-20 DIAGNOSIS — Z713 Dietary counseling and surveillance: Secondary | ICD-10-CM

## 2023-10-20 NOTE — Patient Instructions (Signed)
 Goals Established by Pt  Goal 1: follow the Plate Method at least 1 time per day (1/2 plate non-starchy vegetables, 1/4 plate protein, and 1/4 plate complex carbs.)  Goal 2: use the elliptical 3 times per week for at least 20 minutes.

## 2023-10-20 NOTE — Progress Notes (Signed)
 Medical Nutrition Therapy  Appointment Start time:  [phone]  Appointment End time:  1507  Primary concerns today: overall health   Referral diagnosis: Employee Visit 1, no referral diagnosis Preferred learning style: no preference indicated Learning readiness: ready   NUTRITION ASSESSMENT   Anthropometrics   Wt: 202.3 lb  Clinical Medical Hx: reviewed Medications: reviewed Labs: N/A Notable Signs/Symptoms: none reported Food Allergies: none reported  Lifestyle & Dietary Hx  Pt reports she feels that throughout the covid-19 pandemic and following she has been gaining weight. Pt reports she was up to 220 lb at the highest, and has been trying to lose weight. Pt reports she does not feel her healthiest and states dad has diabetes and mom has hypertension and she wants to avoid these.   Pt reports she works from home. Pt states she is a single mom with a 46 year old son and 87 year old daughter. 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   Pt reports she is intermittent fasting and is not usually hungry until 1pm. Pt states she has been trying to eat at home more over the last 2 weeks. Pt reports she has made multiple changes in her diet over the last few years. Pt reports she used to eat a family size bag of chips daily but stopped this about a year ago. Pt reports she used to drink only soda but cut that out around 2-3 years ago.   Pt states she started trying to use her elliptical consistently about 3 weeks ago and challenges her friend. Pt reports she also has a squat machine and ab machine.    Estimated daily fluid intake: 64 oz Supplements: none reported Sleep: not assessed Stress / self-care: moderate stress Current average weekly physical activity: elliptical 3-4 times per week 15 minutes  24-Hr Dietary Recall First Meal: none Snack: none Second Meal: 1pm: boiled egg and 2 strips bacon OR 1-2 chicken thighs and broccoli  Snack: small bag  chips Third Meal: 4 fried chicken drumettes OR tacos with beef, cheese, taco sauce Snack: none Beverages: 64 oz water, zero sugar gatorade, occasional sweet tea, koolaid occasionally   NUTRITION DIAGNOSIS  NB-1.1 Food and nutrition-related knowledge deficit As related to lack of prior education by a registered dietitian.  As evidenced by pt report.   NUTRITION INTERVENTION  Nutrition education (E-1) on the following topics:   Plate Method Fruits & Vegetables: Aim to fill half your plate with a variety of fruits and vegetables. They are rich in vitamins, minerals, and fiber, and can help reduce the risk of chronic diseases. Choose a colorful assortment of fruits and vegetables to ensure you get a wide range of nutrients. Grains and Starches: Make at least half of your grain choices whole grains, such as brown rice, whole wheat bread, and oats. Whole grains provide fiber, which aids in digestion and healthy cholesterol levels. Aim for whole forms of starchy vegetables such as potatoes, sweet potatoes, beans, peas, and corn, which are fiber rich and provide many vitamins and minerals.  Protein: Incorporate lean sources of protein, such as poultry, fish, beans, nuts, and seeds, into your meals. Protein is essential for building and repairing tissues, staying full, balancing blood sugar, as well as supporting immune function. Dairy: Include low-fat or fat-free dairy products like milk, yogurt, and cheese in your diet. Dairy foods are excellent sources of calcium and vitamin D, which are crucial for bone health.   Exercise Finding an  exercise you enjoy is crucial for maintaining long-term fitness and overall health. Enjoyable activities are more likely to become regular habits, making it easier to stay consistent with physical activity. When you look forward to your workouts, exercise becomes a positive experience rather than a chore, reducing the likelihood of burnout or quitting. Enjoyable exercise  also enhances mental well-being, as engaging in activities you love can boost mood, reduce stress, and provide a sense of accomplishment. Aim for 150 minutes of physical activity weekly. Make physical activity a part of your week. Try to include at least 30 minutes of physical activity 5 days each week or at least 150 minutes per week. Regular physical activity promotes overall health-including helping to reduce risk for heart disease and diabetes, promoting mental health, and helping us  sleep better.      Handouts Provided Include  Plate Method  Learning Style & Readiness for Change Teaching method utilized: Visual & Auditory  Demonstrated degree of understanding via: Teach Back  Barriers to learning/adherence to lifestyle change: none  Goals Established by Pt  Goal 1: follow the Plate Method at least 1 time per day (1/2 plate non-starchy vegetables, 1/4 plate protein, and 1/4 plate complex carbs.)  Goal 2: use the elliptical 3 times per week for at least 20 minutes.   MONITORING & EVALUATION Dietary intake, weekly physical activity, and follow up in 6-8 weeks.  Next Steps  Patient is to call for questions.

## 2023-12-01 ENCOUNTER — Encounter: Attending: Family Medicine | Admitting: Dietician

## 2023-12-01 ENCOUNTER — Encounter: Payer: Self-pay | Admitting: Dietician

## 2023-12-01 VITALS — Wt 199.0 lb

## 2023-12-01 DIAGNOSIS — Z713 Dietary counseling and surveillance: Secondary | ICD-10-CM | POA: Insufficient documentation

## 2023-12-01 NOTE — Progress Notes (Signed)
 Medical Nutrition Therapy  Appointment Start time:  [phone]  Appointment End time:  1440  Primary concerns today: overall health   Referral diagnosis: Employee Visit 2, no referral diagnosis Preferred learning style: no preference indicated Learning readiness: ready   NUTRITION ASSESSMENT   Anthropometrics   Wt 12/01/23: 199 lb Wt 10/20/23: 202.3 lb  Clinical Medical Hx: reviewed Medications: reviewed Labs: N/A Notable Signs/Symptoms: none reported Food Allergies: none reported  Lifestyle & Dietary Hx  Pt reports she had birthday celebration vacation and feels she fell out of her habits with food and exercise. Pt reports this was about 1-2 weeks ago. Pt reports prior to that she was consistent with the elliptical and was doing it almost every day for 20-30 minutes but hasn't done it over the last week.   Pt reports she has been trying to incorporate more vegetables in her diet and follow the plate method more frequently. Pt reports she has been typically eating lunch and dinner with a snack in between. Pt states she got a fruit bowl to have on the counter and has been trying to keep that on hand instead of processed snacks.   Pt reports she has family history of hypertension and she is the only one in her family who is not on medication for it and wants to prevent it. Pt reports she overall wants to work toward improving her health to live a longer healthy life and be around for her kids.   Estimated daily fluid intake: 64 oz Supplements: none reported Sleep: not assessed Stress / self-care: moderate stress Current average weekly physical activity: elliptical 3 times a week for 20-40 minutes  24-Hr Dietary Recall First Meal: none Snack: none Second Meal: 1pm: veggie omelet Snack: fruit Third Meal: protein, starch, vegetable Snack: none Beverages: 64 oz water   NUTRITION DIAGNOSIS  NB-1.1 Food and nutrition-related knowledge deficit As related to lack of prior education  by a registered dietitian.  As evidenced by pt report.   NUTRITION INTERVENTION  Nutrition education (E-1) on the following topics:   Processed Foods Pt is encouraged to consume less processed foods, as they have the potential to negatively affect overall health. Processed foods often destroy or remove nutrients from the product and/or have added salt, sugar, and saturated fats. Although not all processed foods are a concern, it is advised to have the majority of our foods be unprocessed or minimally processed as opposed to processed and ultra-processed. An example of this would be a whole apple (unprocessed), prepackaged apple slices with no additives (minimally processed), unsweetened applesauce (processed), and sweetened applesauce or apple juice with high fructose corn syrup (ultra-processed). For further information please visit [email or web address].   Plate Method Fruits & Vegetables: Aim to fill half your plate with a variety of fruits and vegetables. They are rich in vitamins, minerals, and fiber, and can help reduce the risk of chronic diseases. Choose a colorful assortment of fruits and vegetables to ensure you get a wide range of nutrients. Grains and Starches: Make at least half of your grain choices whole grains, such as brown rice, whole wheat bread, and oats. Whole grains provide fiber, which aids in digestion and healthy cholesterol levels. Aim for whole forms of starchy vegetables such as potatoes, sweet potatoes, beans, peas, and corn, which are fiber rich and provide many vitamins and minerals.  Protein: Incorporate lean sources of protein, such as poultry, fish, beans, nuts, and seeds, into your meals. Protein is essential for building and repairing tissues,  staying full, balancing blood sugar, as well as supporting immune function. Dairy: Include low-fat or fat-free dairy products like milk, yogurt, and cheese in your diet. Dairy  foods are excellent sources of calcium and vitamin D, which are crucial for bone health.   Exercise Finding an exercise you enjoy is crucial for maintaining long-term fitness and overall health. Enjoyable activities are more likely to become regular habits, making it easier to stay consistent with physical activity. When you look forward to your workouts, exercise becomes a positive experience rather than a chore, reducing the likelihood of burnout or quitting. Enjoyable exercise also enhances mental well-being, as engaging in activities you love can boost mood, reduce stress, and provide a sense of accomplishment. Aim for 150 minutes of physical activity weekly. Make physical activity a part of your week. Try to include at least 30 minutes of physical activity 5 days each week or at least 150 minutes per week. Regular physical activity promotes overall health-including helping to reduce risk for heart disease and diabetes, promoting mental health, and helping us  sleep better.      Handouts Provided Include  Plate Method  Learning Style & Readiness for Change Teaching method utilized: Visual & Auditory  Demonstrated degree of understanding via: Teach Back  Barriers to learning/adherence to lifestyle change: none  Goals Established by Pt  New Goals:   Goal 1: re-start the elliptical at least 3 days per week for 25 minutes.   Goal 2: walk outdoors at least once a week for 30 minutes.   Assessment of Previous Goals:   Goal 1: follow the Plate Method at least 1 time per day (1/2 plate non-starchy vegetables, 1/4 plate protein, and 1/4 plate complex carbs.)  Goal 2: use the elliptical 3 times per week for at least 20 minutes. - goal met, increase goal.    MONITORING & EVALUATION Dietary intake, weekly physical activity, and follow up in 4 months.   Next Steps  Patient is to call for questions.

## 2023-12-01 NOTE — Patient Instructions (Signed)
 Goals Established by Pt  New Goals:   Goal 1: re-start the elliptical at least 3 days per week for 25 minutes.   Goal 2: walk outdoors at least once a week for 30 minutes.   Assessment of Previous Goals:   Goal 1: follow the Plate Method at least 1 time per day (1/2 plate non-starchy vegetables, 1/4 plate protein, and 1/4 plate complex carbs.)  Goal 2: use the elliptical 3 times per week for at least 20 minutes. - goal met, increase goal.

## 2024-01-24 DIAGNOSIS — Z0000 Encounter for general adult medical examination without abnormal findings: Secondary | ICD-10-CM | POA: Diagnosis not present

## 2024-01-24 DIAGNOSIS — Z708 Other sex counseling: Secondary | ICD-10-CM | POA: Diagnosis not present

## 2024-02-23 DIAGNOSIS — Z1231 Encounter for screening mammogram for malignant neoplasm of breast: Secondary | ICD-10-CM | POA: Diagnosis not present

## 2024-03-13 DIAGNOSIS — R928 Other abnormal and inconclusive findings on diagnostic imaging of breast: Secondary | ICD-10-CM | POA: Diagnosis not present

## 2024-03-13 DIAGNOSIS — R921 Mammographic calcification found on diagnostic imaging of breast: Secondary | ICD-10-CM | POA: Diagnosis not present

## 2024-03-17 DIAGNOSIS — R921 Mammographic calcification found on diagnostic imaging of breast: Secondary | ICD-10-CM | POA: Diagnosis not present

## 2024-04-04 ENCOUNTER — Ambulatory Visit: Admitting: Dietician
